# Patient Record
Sex: Female | Born: 1980
Health system: Southern US, Community
[De-identification: ages and names within clinical notes are randomized; demographics above are authoritative.]

## PROBLEM LIST (undated history)

## (undated) DIAGNOSIS — Z789 Other specified health status: Secondary | ICD-10-CM

## (undated) HISTORY — PX: LEG SURGERY: SHX1003

## (undated) HISTORY — PX: WISDOM TOOTH EXTRACTION: SHX21

## (undated) HISTORY — PX: NO PAST SURGERIES: SHX2092

---

## 2012-02-25 ENCOUNTER — Ambulatory Visit (INDEPENDENT_AMBULATORY_CARE_PROVIDER_SITE_OTHER): Payer: No Typology Code available for payment source | Admitting: Gynecology

## 2012-02-25 ENCOUNTER — Encounter: Payer: Self-pay | Admitting: Gynecology

## 2012-02-25 VITALS — BP 112/70 | Wt 116.0 lb

## 2012-02-25 DIAGNOSIS — Z3400 Encounter for supervision of normal first pregnancy, unspecified trimester: Secondary | ICD-10-CM

## 2012-02-26 LAB — OBSTETRIC PANEL
Antibody Screen: NEGATIVE
Basophils Relative: 0 % (ref 0–1)
Eosinophils Absolute: 0.1 10*3/uL (ref 0.0–0.7)
Eosinophils Relative: 1 % (ref 0–5)
HCT: 36.8 % (ref 36.0–46.0)
Hemoglobin: 12.4 g/dL (ref 12.0–15.0)
MCH: 29 pg (ref 26.0–34.0)
MCHC: 33.7 g/dL (ref 30.0–36.0)
MCV: 86 fL (ref 78.0–100.0)
Monocytes Absolute: 0.4 10*3/uL (ref 0.1–1.0)
Monocytes Relative: 4 % (ref 3–12)
RDW: 14.1 % (ref 11.5–15.5)
Rh Type: POSITIVE

## 2012-02-26 LAB — HIV ANTIBODY (ROUTINE TESTING W REFLEX): HIV: NONREACTIVE

## 2012-03-03 ENCOUNTER — Ambulatory Visit: Payer: Self-pay | Admitting: Internal Medicine

## 2012-03-04 ENCOUNTER — Ambulatory Visit (INDEPENDENT_AMBULATORY_CARE_PROVIDER_SITE_OTHER): Payer: No Typology Code available for payment source | Admitting: Obstetrics & Gynecology

## 2012-03-04 VITALS — BP 94/61 | Wt 115.0 lb

## 2012-03-04 DIAGNOSIS — Z113 Encounter for screening for infections with a predominantly sexual mode of transmission: Secondary | ICD-10-CM

## 2012-03-04 DIAGNOSIS — Z3490 Encounter for supervision of normal pregnancy, unspecified, unspecified trimester: Secondary | ICD-10-CM | POA: Insufficient documentation

## 2012-03-04 DIAGNOSIS — Z124 Encounter for screening for malignant neoplasm of cervix: Secondary | ICD-10-CM

## 2012-03-04 DIAGNOSIS — Z3480 Encounter for supervision of other normal pregnancy, unspecified trimester: Secondary | ICD-10-CM

## 2012-03-04 NOTE — Patient Instructions (Signed)
Pregnancy - First Trimester  During sexual intercourse, millions of sperm go into the vagina. Only 1 sperm will penetrate and fertilize the female egg while it is in the Fallopian tube. One week later, the fertilized egg implants into the wall of the uterus. An embryo begins to develop into a baby. At 6 to 8 weeks, the eyes and face are formed and the heartbeat can be seen on ultrasound. At the end of 12 weeks (first trimester), all the baby's organs are formed. Now that you are pregnant, you will want to do everything you can to have a healthy baby. Two of the most important things are to get good prenatal care and follow your caregiver's instructions. Prenatal care is all the medical care you receive before the baby's birth. It is given to prevent, find, and treat problems during the pregnancy and childbirth.  PRENATAL EXAMS   During prenatal visits, your weight, blood pressure and urine are checked. This is done to make sure you are healthy and progressing normally during the pregnancy.   A pregnant woman should gain 25 to 35 pounds during the pregnancy. However, if you are over weight or underweight, your caregiver will advise you regarding your weight.   Your caregiver will ask and answer questions for you.   Blood work, cervical cultures, other necessary tests and a Pap test are done during your prenatal exams. These tests are done to check on your health and the probable health of your baby. Tests are strongly recommended and done for HIV with your permission. This is the virus that causes AIDS. These tests are done because medications can be given to help prevent your baby from being born with this infection should you have been infected without knowing it. Blood work is also used to find out your blood type, previous infections and follow your blood levels (hemoglobin).   Low hemoglobin (anemia) is common during pregnancy. Iron and vitamins are given to help prevent this. Later in the pregnancy, blood  tests for diabetes will be done along with any other tests if any problems develop. You may need tests to make sure you and the baby are doing well.   You may need other tests to make sure you and the baby are doing well.  CHANGES DURING THE FIRST TRIMESTER (THE FIRST 3 MONTHS OF PREGNANCY)  Your body goes through many changes during pregnancy. They vary from person to person. Talk to your caregiver about changes you notice and are concerned about. Changes can include:   Your menstrual period stops.   The egg and sperm carry the genes that determine what you look like. Genes from you and your partner are forming a baby. The female genes determine whether the baby is a boy or a girl.   Your body increases in girth and you may feel bloated.   Feeling sick to your stomach (nauseous) and throwing up (vomiting). If the vomiting is uncontrollable, call your caregiver.   Your breasts will begin to enlarge and become tender.   Your nipples may stick out more and become darker.   The need to urinate more. Painful urination may mean you have a bladder infection.   Tiring easily.   Loss of appetite.   Cravings for certain kinds of food.   At first, you may gain or lose a couple of pounds.   You may have changes in your emotions from day to day (excited to be pregnant or concerned something may go wrong with   the pregnancy and baby).   You may have more vivid and strange dreams.  HOME CARE INSTRUCTIONS    It is very important to avoid all smoking, alcohol and un-prescribed drugs during your pregnancy. These affect the formation and growth of the baby. Avoid chemicals while pregnant to ensure the delivery of a healthy infant.   Start your prenatal visits by the 12th week of pregnancy. They are usually scheduled monthly at first, then more often in the last 2 months before delivery. Keep your caregiver's appointments. Follow your caregiver's instructions regarding medication use, blood and lab tests, exercise, and  diet.   During pregnancy, you are providing food for you and your baby. Eat regular, well-balanced meals. Choose foods such as meat, fish, milk and other low fat dairy products, vegetables, fruits, and whole-grain breads and cereals. Your caregiver will tell you of the ideal weight gain.   You can help morning sickness by keeping soda crackers at the bedside. Eat a couple before arising in the morning. You may want to use the crackers without salt on them.   Eating 4 to 5 small meals rather than 3 large meals a day also may help the nausea and vomiting.   Drinking liquids between meals instead of during meals also seems to help nausea and vomiting.   A physical sexual relationship may be continued throughout pregnancy if there are no other problems. Problems may be early (premature) leaking of amniotic fluid from the membranes, vaginal bleeding, or belly (abdominal) pain.   Exercise regularly if there are no restrictions. Check with your caregiver or physical therapist if you are unsure of the safety of some of your exercises. Greater weight gain will occur in the last 2 trimesters of pregnancy. Exercising will help:   Control your weight.   Keep you in shape.   Prepare you for labor and delivery.   Help you lose your pregnancy weight after you deliver your baby.   Wear a good support or jogging bra for breast tenderness during pregnancy. This may help if worn during sleep too.   Ask when prenatal classes are available. Begin classes when they are offered.   Do not use hot tubs, steam rooms or saunas.   Wear your seat belt when driving. This protects you and your baby if you are in an accident.   Avoid raw meat, uncooked cheese, cat litter boxes and soil used by cats throughout the pregnancy. These carry germs that can cause birth defects in the baby.   The first trimester is a good time to visit your dentist for your dental health. Getting your teeth cleaned is OK. Use a softer toothbrush and brush  gently during pregnancy.   Ask for help if you have financial, counseling or nutritional needs during pregnancy. Your caregiver will be able to offer counseling for these needs as well as refer you for other special needs.   Do not take any medications or herbs unless told by your caregiver.   Inform your caregiver if there is any mental or physical domestic violence.   Make a list of emergency phone numbers of family, friends, hospital, and police and fire departments.   Write down your questions. Take them to your prenatal visit.   Do not douche.   Do not cross your legs.   If you have to stand for long periods of time, rotate you feet or take small steps in a circle.   You may have more vaginal secretions that may   require a sanitary pad. Do not use tampons or scented sanitary pads.  MEDICATIONS AND DRUG USE IN PREGNANCY   Take prenatal vitamins as directed. The vitamin should contain 1 milligram of folic acid. Keep all vitamins out of reach of children. Only a couple vitamins or tablets containing iron may be fatal to a baby or young child when ingested.   Avoid use of all medications, including herbs, over-the-counter medications, not prescribed or suggested by your caregiver. Only take over-the-counter or prescription medicines for pain, discomfort, or fever as directed by your caregiver. Do not use aspirin, ibuprofen, or naproxen unless directed by your caregiver.   Let your caregiver also know about herbs you may be using.   Alcohol is related to a number of birth defects. This includes fetal alcohol syndrome. All alcohol, in any form, should be avoided completely. Smoking will cause low birth rate and premature babies.   Street or illegal drugs are very harmful to the baby. They are absolutely forbidden. A baby born to an addicted mother will be addicted at birth. The baby will go through the same withdrawal an adult does.   Let your caregiver know about any medications that you have to take  and for what reason you take them.  MISCARRIAGE IS COMMON DURING PREGNANCY  A miscarriage does not mean you did something wrong. It is not a reason to worry about getting pregnant again. Your caregiver will help you with questions you may have. If you have a miscarriage, you may need minor surgery.  SEEK MEDICAL CARE IF:   You have any concerns or worries during your pregnancy. It is better to call with your questions if you feel they cannot wait, rather than worry about them.  SEEK IMMEDIATE MEDICAL CARE IF:    An unexplained oral temperature above 102 F (38.9 C) develops, or as your caregiver suggests.   You have leaking of fluid from the vagina (birth canal). If leaking membranes are suspected, take your temperature and inform your caregiver of this when you call.   There is vaginal spotting or bleeding. Notify your caregiver of the amount and how many pads are used.   You develop a bad smelling vaginal discharge with a change in the color.   You continue to feel sick to your stomach (nauseated) and have no relief from remedies suggested. You vomit blood or coffee ground-like materials.   You lose more than 2 pounds of weight in 1 week.   You gain more than 2 pounds of weight in 1 week and you notice swelling of your face, hands, feet, or legs.   You gain 5 pounds or more in 1 week (even if you do not have swelling of your hands, face, legs, or feet).   You get exposed to German measles and have never had them.   You are exposed to fifth disease or chickenpox.   You develop belly (abdominal) pain. Round ligament discomfort is a common non-cancerous (benign) cause of abdominal pain in pregnancy. Your caregiver still must evaluate this.   You develop headache, fever, diarrhea, pain with urination, or shortness of breath.   You fall or are in a car accident or have any kind of trauma.   There is mental or physical violence in your home.  Document Released: 08/19/2001 Document Revised: 08/14/2011  Document Reviewed: 02/20/2009  [employer] Patient Information 2012 [employer]

## 2012-03-04 NOTE — Progress Notes (Signed)
   Subjective:    Sarah Blevins is a G1P0 [redacted]w[redacted]d being seen today for her first obstetrical visit.  Patient reports no complaints.  There were no vitals filed for this visit.  HISTORY: OB History    Grav Para Term Preterm Abortions TAB SAB Ect Mult Living   1              # Outc Date GA Lbr Len/2nd Wgt Sex Del Anes PTL Lv   1 CUR              No past medical history on file. Past Surgical History  Procedure Date  . Leg surgery     right leg blood cyst removal.  . Wisdom tooth extraction     x2   Family History  Problem Relation Age of Onset  . Anemia Mother   . Hypertension Father      Exam   Blood pressure 94/61, weight 115 lb (52.164 kg), last menstrual period 12/13/2011.  Uterus:   12 week size  Pelvic Exam:    Perineum: No Hemorrhoids   Vulva: normal   Vagina:  normal mucosa   Cervix: normal   Adnexa: normal adnexa   Bony Pelvis: gynecoid  System: Breast:  normal appearance, no masses or tenderness   Skin: normal coloration and turgor, no rashes   Neurologic: normal   Extremities: normal strength, tone, and muscle mass   HEENT PERRLA   Mouth/Teeth mucous membranes moist, pharynx normal without lesions and dental hygiene good   Neck supple and no masses   Cardiovascular: regular rate and rhythm   Respiratory:  appears well, vitals normal, no respiratory distress, acyanotic, normal RR   Abdomen: soft, non-tender; bowel sounds normal; no masses,  no organomegaly   Urinary: urethral meatus normal  Bedside u/s showed IUP consistent with LMP for GA, + FH   Assessment:    Pregnancy: G1P0 Patient Active Problem List  Diagnosis  . Supervision of normal pregnancy    Plan:     Initial labs normal. Continue prenatal vitamins. Problem list reviewed and updated. Genetic Screening discussed: Declined First Screen, considering Quad Screen  Ultrasound discussed; fetal survey: will be ordered during next visit.  Follow up in 4 weeks.  Gleason Ardoin  A 03/04/2012

## 2012-03-10 ENCOUNTER — Encounter: Payer: Self-pay | Admitting: Obstetrics & Gynecology

## 2012-03-10 ENCOUNTER — Encounter: Payer: No Typology Code available for payment source | Admitting: Obstetrics & Gynecology

## 2012-03-10 DIAGNOSIS — IMO0002 Reserved for concepts with insufficient information to code with codable children: Secondary | ICD-10-CM

## 2012-03-10 HISTORY — DX: Reserved for concepts with insufficient information to code with codable children: IMO0002

## 2012-03-17 NOTE — Progress Notes (Signed)
Sarah Blevins spoke with husband, per husband will not be able to make an appointment until 3 months when he re-establish his medical insurance.

## 2012-03-22 ENCOUNTER — Ambulatory Visit: Payer: Self-pay | Admitting: Family Medicine

## 2012-07-19 ENCOUNTER — Ambulatory Visit (HOSPITAL_COMMUNITY)
Admission: RE | Admit: 2012-07-19 | Discharge: 2012-07-19 | Disposition: A | Discharge status: Home / Self Care | Payer: BC Managed Care – PPO | Source: Ambulatory Visit | Attending: Family Medicine | Admitting: Family Medicine

## 2012-07-19 ENCOUNTER — Encounter: Payer: Self-pay | Admitting: Family Medicine

## 2012-07-19 ENCOUNTER — Ambulatory Visit (INDEPENDENT_AMBULATORY_CARE_PROVIDER_SITE_OTHER): Payer: BC Managed Care – PPO | Admitting: Family Medicine

## 2012-07-19 VITALS — BP 96/64 | Wt 145.0 lb

## 2012-07-19 DIAGNOSIS — IMO0002 Reserved for concepts with insufficient information to code with codable children: Secondary | ICD-10-CM

## 2012-07-19 DIAGNOSIS — O0930 Supervision of pregnancy with insufficient antenatal care, unspecified trimester: Secondary | ICD-10-CM | POA: Insufficient documentation

## 2012-07-19 DIAGNOSIS — Z3490 Encounter for supervision of normal pregnancy, unspecified, unspecified trimester: Secondary | ICD-10-CM

## 2012-07-19 DIAGNOSIS — Z36 Encounter for antenatal screening of mother: Secondary | ICD-10-CM | POA: Insufficient documentation

## 2012-07-19 DIAGNOSIS — Z3480 Encounter for supervision of other normal pregnancy, unspecified trimester: Secondary | ICD-10-CM

## 2012-07-19 DIAGNOSIS — Z23 Encounter for immunization: Secondary | ICD-10-CM

## 2012-07-19 DIAGNOSIS — R87811 Vaginal high risk human papillomavirus (HPV) DNA test positive: Secondary | ICD-10-CM

## 2012-07-19 MED ORDER — TETANUS-DIPHTH-ACELL PERTUSSIS 5-2.5-18.5 LF-MCG/0.5 IM SUSP: 0.5000 mL | Freq: Once | INTRAMUSCULAR | Status: DC

## 2012-07-19 NOTE — Progress Notes (Signed)
Needs U/S for anatomy 28 wk labs Will hold colpo until pp. TDaP

## 2012-07-19 NOTE — Patient Instructions (Signed)
Pregnancy - Third Trimester The third trimester of pregnancy (the last 3 months) is a period of the most rapid growth for you and your baby. The baby approaches a length of 20 inches and a weight of 6 to 10 pounds. The baby is adding on fat and getting ready for life outside your body. While inside, babies have periods of sleeping and waking, suck their thumbs, and hiccups. You can often feel small contractions of the uterus. This is false labor. It is also called Braxton-Hicks contractions. This is like a practice for labor. The usual problems in this stage of pregnancy include more difficulty breathing, swelling of the hands and feet from water retention, and having to urinate more often because of the uterus and baby pressing on your bladder.  PRENATAL EXAMS  Blood work may continue to be done during prenatal exams. These tests are done to check on your health and the probable health of your baby. Blood work is used to follow your blood levels (hemoglobin). Anemia (low hemoglobin) is common during pregnancy. Iron and vitamins are given to help prevent this. You may also continue to be checked for diabetes. Some of the past blood tests may be done again.  The size of the uterus is measured during each visit. This makes sure your baby is growing properly according to your pregnancy dates.  Your blood pressure is checked every prenatal visit. This is to make sure you are not getting toxemia.  Your urine is checked every prenatal visit for infection, diabetes and protein.  Your weight is checked at each visit. This is done to make sure gains are happening at the suggested rate and that you and your baby are growing normally.  Sometimes, an ultrasound is performed to confirm the position and the proper growth and development of the baby. This is a test done that bounces harmless sound waves off the baby so your caregiver can more accurately determine due dates.  Discuss the type of pain medication  and anesthesia you will have during your labor and delivery.  Discuss the possibility and anesthesia if a Cesarean Section might be necessary.  Inform your caregiver if there is any mental or physical violence at home. Sometimes, a specialized non-stress test, contraction stress test and biophysical profile are done to make sure the baby is not having a problem. Checking the amniotic fluid surrounding the baby is called an amniocentesis. The amniotic fluid is removed by sticking a needle into the belly (abdomen). This is sometimes done near the end of pregnancy if an early delivery is required. In this case, it is done to help make sure the baby's lungs are mature enough for the baby to live outside of the womb. If the lungs are not mature and it is unsafe to deliver the baby, an injection of cortisone medication is given to the mother 1 to 2 days before the delivery. This helps the baby's lungs mature and makes it safer to deliver the baby. CHANGES OCCURING IN THE THIRD TRIMESTER OF PREGNANCY Your body goes through many changes during pregnancy. They vary from person to person. Talk to your caregiver about changes you notice and are concerned about.  During the last trimester, you have probably had an increase in your appetite. It is normal to have cravings for certain foods. This varies from person to person and pregnancy to pregnancy.  You may begin to get stretch marks on your hips, abdomen, and breasts. These are normal changes in the body   during pregnancy. There are no exercises or medications to take which prevent this change.  Constipation may be treated with a stool softener or adding bulk to your diet. Drinking lots of fluids, fiber in vegetables, fruits, and whole grains are helpful.  Exercising is also helpful. If you have been very active up until your pregnancy, most of these activities can be continued during your pregnancy. If you have been less active, it is helpful to start an  exercise program such as walking. Consult your caregiver before starting exercise programs.  Avoid all smoking, alcohol, un-prescribed drugs, herbs and "street drugs" during your pregnancy. These chemicals affect the formation and growth of the baby. Avoid chemicals throughout the pregnancy to ensure the delivery of a healthy infant.  Backache, varicose veins and hemorrhoids may develop or get worse.  You will tire more easily in the third trimester, which is normal.  The baby's movements may be stronger and more often.  You may become short of breath easily.  Your belly button may stick out.  A yellow discharge may leak from your breasts called colostrum.  You may have a bloody mucus discharge. This usually occurs a few days to a week before labor begins. HOME CARE INSTRUCTIONS   Keep your caregiver's appointments. Follow your caregiver's instructions regarding medication use, exercise, and diet.  During pregnancy, you are providing food for you and your baby. Continue to eat regular, well-balanced meals. Choose foods such as meat, fish, milk and other low fat dairy products, vegetables, fruits, and whole-grain breads and cereals. Your caregiver will tell you of the ideal weight gain.  A physical sexual relationship may be continued throughout pregnancy if there are no other problems such as early (premature) leaking of amniotic fluid from the membranes, vaginal bleeding, or belly (abdominal) pain.  Exercise regularly if there are no restrictions. Check with your caregiver if you are unsure of the safety of your exercises. Greater weight gain will occur in the last 2 trimesters of pregnancy. Exercising helps:  Control your weight.  Get you in shape for labor and delivery.  You lose weight after you deliver.  Rest a lot with legs elevated, or as needed for leg cramps or low back pain.  Wear a good support or jogging bra for breast tenderness during pregnancy. This may help if worn  during sleep. Pads or tissues may be used in the bra if you are leaking colostrum.  Do not use hot tubs, steam rooms, or saunas.  Wear your seat belt when driving. This protects you and your baby if you are in an accident.  Avoid raw meat, cat litter boxes and soil used by cats. These carry germs that can cause birth defects in the baby.  It is easier to loose urine during pregnancy. Tightening up and strengthening the pelvic muscles will help with this problem. You can practice stopping your urination while you are going to the bathroom. These are the same muscles you need to strengthen. It is also the muscles you would use if you were trying to stop from passing gas. You can practice tightening these muscles up 10 times a set and repeating this about 3 times per day. Once you know what muscles to tighten up, do not perform these exercises during urination. It is more likely to cause an infection by backing up the urine.  Ask for help if you have financial, counseling or nutritional needs during pregnancy. Your caregiver will be able to offer counseling for these   needs as well as refer you for other special needs.  Make a list of emergency phone numbers and have them available.  Plan on getting help from family or friends when you go home from the hospital.  Make a trial run to the hospital.  Take prenatal classes with the father to understand, practice and ask questions about the labor and delivery.  Prepare the baby's room/nursery.  Do not travel out of the city unless it is absolutely necessary and with the advice of your caregiver.  Wear only low or no heal shoes to have better balance and prevent falling. MEDICATIONS AND DRUG USE IN PREGNANCY  Take prenatal vitamins as directed. The vitamin should contain 1 milligram of folic acid. Keep all vitamins out of reach of children. Only a couple vitamins or tablets containing iron may be fatal to a baby or young child when  ingested.  Avoid use of all medications, including herbs, over-the-counter medications, not prescribed or suggested by your caregiver. Only take over-the-counter or prescription medicines for pain, discomfort, or fever as directed by your caregiver. Do not use aspirin, ibuprofen (Motrin, Advil, Nuprin) or naproxen (Aleve) unless OK'd by your caregiver.  Let your caregiver also know about herbs you may be using.  Alcohol is related to a number of birth defects. This includes fetal alcohol syndrome. All alcohol, in any form, should be avoided completely. Smoking will cause low birth rate and premature babies.  Street/illegal drugs are very harmful to the baby. They are absolutely forbidden. A baby born to an addicted mother will be addicted at birth. The baby will go through the same withdrawal an adult does. SEEK MEDICAL CARE IF: You have any concerns or worries during your pregnancy. It is better to call with your questions if you feel they cannot wait, rather than worry about them. DECISIONS ABOUT CIRCUMCISION You may or may not know the sex of your baby. If you know your baby is a boy, it may be time to think about circumcision. Circumcision is the removal of the foreskin of the penis. This is the skin that covers the sensitive end of the penis. There is no proven medical need for this. Often this decision is made on what is popular at the time or based upon religious beliefs and social issues. You can discuss these issues with your caregiver or pediatrician. SEEK IMMEDIATE MEDICAL CARE IF:   An unexplained oral temperature above 102 F (38.9 C) develops, or as your caregiver suggests.  You have leaking of fluid from the vagina (birth canal). If leaking membranes are suspected, take your temperature and tell your caregiver of this when you call.  There is vaginal spotting, bleeding or passing clots. Tell your caregiver of the amount and how many pads are used.  You develop a bad smelling  vaginal discharge with a change in the color from clear to white.  You develop vomiting that lasts more than 24 hours.  You develop chills or fever.  You develop shortness of breath.  You develop burning on urination.  You loose more than 2 pounds of weight or gain more than 2 pounds of weight or as suggested by your caregiver.  You notice sudden swelling of your face, hands, and feet or legs.  You develop belly (abdominal) pain. Round ligament discomfort is a common non-cancerous (benign) cause of abdominal pain in pregnancy. Your caregiver still must evaluate you.  You develop a severe headache that does not go away.  You develop visual   problems, blurred or double vision.  If you have not felt your baby move for more than 1 hour. If you think the baby is not moving as much as usual, eat something with sugar in it and lie down on your left side for an hour. The baby should move at least 4 to 5 times per hour. Call right away if your baby moves less than that.  You fall, are in a car accident or any kind of trauma.  There is mental or physical violence at home. Document Released: 08/19/2001 Document Revised: 11/17/2011 Document Reviewed: 02/21/2009 [employer] Patient Information 2013 [employer].  Breastfeeding Deciding to breastfeed is one of the best choices you can make for you and your baby. The information that follows gives a brief overview of the benefits of breastfeeding as well as common topics surrounding breastfeeding. BENEFITS OF BREASTFEEDING For the baby  The first milk (colostrum) helps the baby's digestive system function better.   There are antibodies in the mother's milk that help the baby fight off infections.   The baby has a lower incidence of asthma, allergies, and sudden infant death syndrome (SIDS).   The nutrients in breast milk are better for the baby than infant formulas, and breast milk helps the baby's brain grow better.   Babies who  breastfeed have less gas, colic, and constipation.  For the mother  Breastfeeding helps develop a very special bond between the mother and her baby.   Breastfeeding is convenient, always available at the correct temperature, and costs nothing.   Breastfeeding burns calories in the mother and helps her lose weight that was gained during pregnancy.   Breastfeeding makes the uterus contract back down to normal size faster and slows bleeding following delivery.   Breastfeeding mothers have a lower risk of developing breast cancer.  BREASTFEEDING FREQUENCY  A healthy, full-term baby may breastfeed as often as every hour or space his or her feedings to every 3 hours.   Watch your baby for signs of hunger. Nurse your baby if he or she shows signs of hunger. How often you nurse will vary from baby to baby.   Nurse as often as the baby requests, or when you feel the need to reduce the fullness of your breasts.   Awaken the baby if it has been 3 4 hours since the last feeding.   Frequent feeding will help the mother make more milk and will help prevent problems, such as sore nipples and engorgement of the breasts.  BABY'S POSITION AT THE BREAST  Whether lying down or sitting, be sure that the baby's tummy is facing your tummy.   Support the breast with 4 fingers underneath the breast and the thumb above. Make sure your fingers are well away from the nipple and baby's mouth.   Stroke the baby's lips gently with your finger or nipple.   When the baby's mouth is open wide enough, place all of your nipple and as much of the areola as possible into your baby's mouth.   Pull the baby in close so the tip of the nose and the baby's cheeks touch the breast during the feeding.  FEEDINGS AND SUCTION  The length of each feeding varies from baby to baby and from feeding to feeding.   The baby must suck about 2 3 minutes for your milk to get to him or her. This is called a "let down."  For this reason, allow the baby to feed on each breast as   long as he or she wants. Your baby will end the feeding when he or she has received the right balance of nutrients.   To break the suction, put your finger into the corner of the baby's mouth and slide it between his or her gums before removing your breast from his or her mouth. This will help prevent sore nipples.  HOW TO TELL WHETHER YOUR BABY IS GETTING ENOUGH BREAST MILK. Wondering whether or not your baby is getting enough milk is a common concern among mothers. You can be assured that your baby is getting enough milk if:   Your baby is actively sucking and you hear swallowing.   Your baby seems relaxed and satisfied after a feeding.   Your baby nurses at least 8 12 times in a 24 hour time period. Nurse your baby until he or she unlatches or falls asleep at the first breast (at least 10 20 minutes), then offer the second side.   Your baby is wetting 5 6 disposable diapers (6 8 cloth diapers) in a 24 hour period by 5 6 days of age.   Your baby is having at least 3 4 stools every 24 hours for the first 6 weeks. The stool should be soft and yellow.   Your baby should gain 4 7 ounces per week after he or she is 4 days old.   Your breasts feel softer after nursing.  REDUCING BREAST ENGORGEMENT  In the first week after your baby is born, you may experience signs of breast engorgement. When breasts are engorged, they feel heavy, warm, full, and may be tender to the touch. You can reduce engorgement if you:   Nurse frequently, every 2 3 hours. Mothers who breastfeed early and often have fewer problems with engorgement.   Place light ice packs on your breasts for 10 20 minutes between feedings. This reduces swelling. Wrap the ice packs in a lightweight towel to protect your skin. Bags of frozen vegetables work well for this purpose.   Take a warm shower or apply warm, moist heat to your breast for 5 10 minutes just before  each feeding. This increases circulation and helps the milk flow.   Gently massage your breast before and during the feeding. Using your finger tips, massage from the chest wall towards your nipple in a circular motion.   Make sure that the baby empties at least one breast at every feeding before switching sides.   Use a breast pump to empty the breasts if your baby is sleepy or not nursing well. You may also want to pump if you are returning to work oryou feel you are getting engorged.   Avoid bottle feeds, pacifiers, or supplemental feedings of water or juice in place of breastfeeding. Breast milk is all the food your baby needs. It is not necessary for your baby to have water or formula. In fact, to help your breasts make more milk, it is best not to give your baby supplemental feedings during the early weeks.   Be sure the baby is latched on and positioned properly while breastfeeding.   Wear a supportive bra, avoiding underwire styles.   Eat a balanced diet with enough fluids.   Rest often, relax, and take your prenatal vitamins to prevent fatigue, stress, and anemia.  If you follow these suggestions, your engorgement should improve in 24 48 hours. If you are still experiencing difficulty, call your lactation consultant or caregiver.  CARING FOR YOURSELF Take care of your   breasts  Bathe or shower daily.   Avoid using soap on your nipples.   Start feedings on your left breast at one feeding and on your right breast at the next feeding.   You will notice an increase in your milk supply 2 5 days after delivery. You may feel some discomfort from engorgement, which makes your breasts very firm and often tender. Engorgement "peaks" out within 24 48 hours. In the meantime, apply warm moist towels to your breasts for 5 10 minutes before feeding. Gentle massage and expression of some milk before feeding will soften your breasts, making it easier for your baby to latch on.    Wear a well-fitting nursing bra, and air dry your nipples for a 3 4minutes after each feeding.   Only use cotton bra pads.   Only use pure lanolin on your nipples after nursing. You do not need to wash it off before feeding the baby again. Another option is to express a few drops of breast milk and gently massage it into your nipples.  Take care of yourself  Eat well-balanced meals and nutritious snacks.   Drinking milk, fruit juice, and water to satisfy your thirst (about 8 glasses a day).   Get plenty of rest.  Avoid foods that you notice affect the baby in a bad way.  SEEK MEDICAL CARE IF:   You have difficulty with breastfeeding and need help.   You have a hard, red, sore area on your breast that is accompanied by a fever.   Your baby is too sleepy to eat well or is having trouble sleeping.   Your baby is wetting less than 6 diapers a day, by 5 days of age.   Your baby's skin or white part of his or her eyes is more yellow than it was in the hospital.   You feel depressed.  Document Released: 08/25/2005 Document Revised: 02/24/2012 Document Reviewed: 11/23/2011 [employer] Patient Information 2013 [employer].  

## 2012-07-20 ENCOUNTER — Encounter: Payer: Self-pay | Admitting: Family Medicine

## 2012-07-20 LAB — GLUCOSE TOLERANCE, 1 HOUR (50G) W/O FASTING: Glucose, 1 Hour GTT: 123 mg/dL (ref 70–140)

## 2012-07-20 LAB — RPR

## 2012-08-25 ENCOUNTER — Ambulatory Visit (INDEPENDENT_AMBULATORY_CARE_PROVIDER_SITE_OTHER): Payer: BC Managed Care – PPO | Admitting: Obstetrics & Gynecology

## 2012-08-25 VITALS — BP 89/61 | Wt 148.0 lb

## 2012-08-25 DIAGNOSIS — O26899 Other specified pregnancy related conditions, unspecified trimester: Secondary | ICD-10-CM

## 2012-08-25 DIAGNOSIS — B373 Candidiasis of vulva and vagina: Secondary | ICD-10-CM

## 2012-08-25 DIAGNOSIS — Z3490 Encounter for supervision of normal pregnancy, unspecified, unspecified trimester: Secondary | ICD-10-CM

## 2012-08-25 DIAGNOSIS — N898 Other specified noninflammatory disorders of vagina: Secondary | ICD-10-CM

## 2012-08-25 DIAGNOSIS — Z3480 Encounter for supervision of other normal pregnancy, unspecified trimester: Secondary | ICD-10-CM

## 2012-08-25 NOTE — Patient Instructions (Signed)
Return to clinic for any obstetric concerns or go to [facility] for evaluation  

## 2012-08-25 NOTE — Progress Notes (Signed)
Normal anatomy scan and third trimester labs. Pelvic cultures done, yellow discharge seen, wet prep obtained.  Will follow up results and manage accordingly.  No other complaints or concerns.  Fetal movement and labor precautions reviewed.

## 2012-08-26 LAB — GC/CHLAMYDIA PROBE AMP, GENITAL
Chlamydia, DNA Probe: NEGATIVE
GC Probe Amp, Genital: NEGATIVE

## 2012-08-26 LAB — WET PREP, GENITAL: Trich, Wet Prep: NONE SEEN

## 2012-08-26 MED ORDER — FLUCONAZOLE 150 MG PO TABS: 150.0000 mg | ORAL_TABLET | Freq: Once | ORAL | Status: DC

## 2012-08-26 NOTE — Addendum Note (Signed)
Addended by: Jaynie Collins A on: 08/26/2012 10:26 AM   Modules accepted: Orders

## 2012-08-26 NOTE — Progress Notes (Signed)
TNTC yeast on wet prep, Diflucan prescribed.

## 2012-08-31 ENCOUNTER — Encounter: Payer: Self-pay | Admitting: Obstetrics & Gynecology

## 2012-09-02 ENCOUNTER — Ambulatory Visit (INDEPENDENT_AMBULATORY_CARE_PROVIDER_SITE_OTHER): Payer: BC Managed Care – PPO | Admitting: Family Medicine

## 2012-09-02 ENCOUNTER — Encounter: Payer: Self-pay | Admitting: Family Medicine

## 2012-09-02 VITALS — BP 101/69 | Wt 153.0 lb

## 2012-09-02 DIAGNOSIS — Z3490 Encounter for supervision of normal pregnancy, unspecified, unspecified trimester: Secondary | ICD-10-CM

## 2012-09-02 DIAGNOSIS — Z3480 Encounter for supervision of other normal pregnancy, unspecified trimester: Secondary | ICD-10-CM

## 2012-09-02 NOTE — Progress Notes (Signed)
Doing well No complaints Labor precautions.

## 2012-09-02 NOTE — Patient Instructions (Addendum)
Breastfeeding Deciding to breastfeed is one of the best choices you can make for you and your baby. The information that follows gives a brief overview of the benefits of breastfeeding as well as common topics surrounding breastfeeding. BENEFITS OF BREASTFEEDING For the baby  The first milk (colostrum) helps the baby's digestive system function better.   There are antibodies in the mother's milk that help the baby fight off infections.   The baby has a lower incidence of asthma, allergies, and sudden infant death syndrome (SIDS).   The nutrients in breast milk are better for the baby than infant formulas, and breast milk helps the baby's brain grow better.   Babies who breastfeed have less gas, colic, and constipation.  For the mother  Breastfeeding helps develop a very special bond between the mother and her baby.   Breastfeeding is convenient, always available at the correct temperature, and costs nothing.   Breastfeeding burns calories in the mother and helps her lose weight that was gained during pregnancy.   Breastfeeding makes the uterus contract back down to normal size faster and slows bleeding following delivery.   Breastfeeding mothers have a lower risk of developing breast cancer.  BREASTFEEDING FREQUENCY  A healthy, full-term baby may breastfeed as often as every hour or space his or her feedings to every 3 hours.   Watch your baby for signs of hunger. Nurse your baby if he or she shows signs of hunger. How often you nurse will vary from baby to baby.   Nurse as often as the baby requests, or when you feel the need to reduce the fullness of your breasts.   Awaken the baby if it has been 3 4 hours since the last feeding.   Frequent feeding will help the mother make more milk and will help prevent problems, such as sore nipples and engorgement of the breasts.  BABY'S POSITION AT THE BREAST  Whether lying down or sitting, be sure that the baby's tummy  is facing your tummy.   Support the breast with 4 fingers underneath the breast and the thumb above. Make sure your fingers are well away from the nipple and baby's mouth.   Stroke the baby's lips gently with your finger or nipple.   When the baby's mouth is open wide enough, place all of your nipple and as much of the areola as possible into your baby's mouth.   Pull the baby in close so the tip of the nose and the baby's cheeks touch the breast during the feeding.  FEEDINGS AND SUCTION  The length of each feeding varies from baby to baby and from feeding to feeding.   The baby must suck about 2 3 minutes for your milk to get to him or her. This is called a "let down." For this reason, allow the baby to feed on each breast as long as he or she wants. Your baby will end the feeding when he or she has received the right balance of nutrients.   To break the suction, put your finger into the corner of the baby's mouth and slide it between his or her gums before removing your breast from his or her mouth. This will help prevent sore nipples.  HOW TO TELL WHETHER YOUR BABY IS GETTING ENOUGH BREAST MILK. Wondering whether or not your baby is getting enough milk is a common concern among mothers. You can be assured that your baby is getting enough milk if:   Your baby is actively   sucking and you hear swallowing.   Your baby seems relaxed and satisfied after a feeding.   Your baby nurses at least 8 12 times in a 24 hour time period. Nurse your baby until he or she unlatches or falls asleep at the first breast (at least 10 20 minutes), then offer the second side.   Your baby is wetting 5 6 disposable diapers (6 8 cloth diapers) in a 24 hour period by 5 6 days of age.   Your baby is having at least 3 4 stools every 24 hours for the first 6 weeks. The stool should be soft and yellow.   Your baby should gain 4 7 ounces per week after he or she is 4 days old.   Your breasts feel  softer after nursing.  REDUCING BREAST ENGORGEMENT  In the first week after your baby is born, you may experience signs of breast engorgement. When breasts are engorged, they feel heavy, warm, full, and may be tender to the touch. You can reduce engorgement if you:   Nurse frequently, every 2 3 hours. Mothers who breastfeed early and often have fewer problems with engorgement.   Place light ice packs on your breasts for 10 20 minutes between feedings. This reduces swelling. Wrap the ice packs in a lightweight towel to protect your skin. Bags of frozen vegetables work well for this purpose.   Take a warm shower or apply warm, moist heat to your breast for 5 10 minutes just before each feeding. This increases circulation and helps the milk flow.   Gently massage your breast before and during the feeding. Using your finger tips, massage from the chest wall towards your nipple in a circular motion.   Make sure that the baby empties at least one breast at every feeding before switching sides.   Use a breast pump to empty the breasts if your baby is sleepy or not nursing well. You may also want to pump if you are returning to work oryou feel you are getting engorged.   Avoid bottle feeds, pacifiers, or supplemental feedings of water or juice in place of breastfeeding. Breast milk is all the food your baby needs. It is not necessary for your baby to have water or formula. In fact, to help your breasts make more milk, it is best not to give your baby supplemental feedings during the early weeks.   Be sure the baby is latched on and positioned properly while breastfeeding.   Wear a supportive bra, avoiding underwire styles.   Eat a balanced diet with enough fluids.   Rest often, relax, and take your prenatal vitamins to prevent fatigue, stress, and anemia.  If you follow these suggestions, your engorgement should improve in 24 48 hours. If you are still experiencing difficulty, call  your lactation consultant or caregiver.  CARING FOR YOURSELF Take care of your breasts  Bathe or shower daily.   Avoid using soap on your nipples.   Start feedings on your left breast at one feeding and on your right breast at the next feeding.   You will notice an increase in your milk supply 2 5 days after delivery. You may feel some discomfort from engorgement, which makes your breasts very firm and often tender. Engorgement "peaks" out within 24 48 hours. In the meantime, apply warm moist towels to your breasts for 5 10 minutes before feeding. Gentle massage and expression of some milk before feeding will soften your breasts, making it easier for your   baby to latch on.   Wear a well-fitting nursing bra, and air dry your nipples for a 3 4minutes after each feeding.   Only use cotton bra pads.   Only use pure lanolin on your nipples after nursing. You do not need to wash it off before feeding the baby again. Another option is to express a few drops of breast milk and gently massage it into your nipples.  Take care of yourself  Eat well-balanced meals and nutritious snacks.   Drinking milk, fruit juice, and water to satisfy your thirst (about 8 glasses a day).   Get plenty of rest.  Avoid foods that you notice affect the baby in a bad way.  SEEK MEDICAL CARE IF:   You have difficulty with breastfeeding and need help.   You have a hard, red, sore area on your breast that is accompanied by a fever.   Your baby is too sleepy to eat well or is having trouble sleeping.   Your baby is wetting less than 6 diapers a day, by 5 days of age.   Your baby's skin or white part of his or her eyes is more yellow than it was in the hospital.   You feel depressed.  Document Released: 08/25/2005 Document Revised: 02/24/2012 Document Reviewed: 11/23/2011 Exit[employer] Patient Information 2013 [employer]. Normal Labor and Delivery Your caregiver must first be sure you are in  labor. Signs of labor include:  You may pass what is called "the mucus plug" before labor begins. This is a small amount of blood stained mucus.  Regular uterine contractions.  The time between contractions get closer together.  The discomfort and pain gradually gets more intense.  Pains are mostly located in the back.  Pains get worse when walking.  The cervix (the opening of the uterus becomes thinner (begins to efface) and opens up (dilates). Once you are in labor and admitted into the hospital or care center, your caregiver will do the following:  A complete physical examination.  Check your vital signs (blood pressure, pulse, temperature and the fetal heart rate).  Do a vaginal examination (using a sterile glove and lubricant) to determine:  The position (presentation) of the baby (head [vertex] or buttock first).  The level (station) of the baby's head in the birth canal.  The effacement and dilatation of the cervix.  You may have your pubic hair shaved and be given an enema depending on your caregiver and the circumstance.  An electronic monitor is usually placed on your abdomen. The monitor follows the length and intensity of the contractions, as well as the baby's heart rate.  Usually, your caregiver will insert an IV in your arm with a bottle of sugar water. This is done as a precaution so that medications can be given to you quickly during labor or delivery. NORMAL LABOR AND DELIVERY IS DIVIDED UP INTO 3 STAGES: First Stage This is when regular contractions begin and the cervix begins to efface and dilate. This stage can last from 3 to 15 hours. The end of the first stage is when the cervix is 100% effaced and 10 centimeters dilated. Pain medications may be given by   Injection (morphine, demerol, etc.)  Regional anesthesia (spinal, caudal or epidural, anesthetics given in different locations of the spine). Paracervical pain medication may be given, which is an  injection of and anesthetic on each side of the cervix. A pregnant woman may request to have "Natural Childbirth" which is not to have   any medications or anesthesia during her labor and delivery. Second Stage This is when the baby comes down through the birth canal (vagina) and is born. This can take 1 to 4 hours. As the baby's head comes down through the birth canal, you may feel like you are going to have a bowel movement. You will get the urge to bear down and push until the baby is delivered. As the baby's head is being delivered, the caregiver will decide if an episiotomy (a cut in the perineum and vagina area) is needed to prevent tearing of the tissue in this area. The episiotomy is sewn up after the delivery of the baby and placenta. Sometimes a mask with nitrous oxide is given for the mother to breath during the delivery of the baby to help if there is too much pain. The end of Stage 2 is when the baby is fully delivered. Then when the umbilical cord stops pulsating it is clamped and cut. Third Stage The third stage begins after the baby is completely delivered and ends after the placenta (afterbirth) is delivered. This usually takes 5 to 30 minutes. After the placenta is delivered, a medication is given either by intravenous or injection to help contract the uterus and prevent bleeding. The third stage is not painful and pain medication is usually not necessary. If an episiotomy was done, it is repaired at this time. After the delivery, the mother is watched and monitored closely for 1 to 2 hours to make sure there is no postpartum bleeding (hemorrhage). If there is a lot of bleeding, medication is given to contract the uterus and stop the bleeding. Document Released: 06/03/2008 Document Revised: 11/17/2011 Document Reviewed: 06/03/2008 Exit[employer] Patient Information 2013 [employer].  

## 2012-09-02 NOTE — Assessment & Plan Note (Signed)
Doing well 

## 2012-09-08 NOTE — L&D Delivery Note (Signed)
Delivery Note Sarah Blevins is a 32 y.o. G1P1001 presenting at [redacted]w[redacted]d with spontaneous, active labor. She progressed normally to complete dilation and began pushing at 9:15 AM.  At 10:10 AM a viable female was delivered via Vaginal, Spontaneous Delivery (Presentation:Vertex, OA).  APGAR: 9, 9; weight pending.   Placenta status: Intact, Spontaneous.  Cord: 3 vessels with the following complications: 2nd degree laceration.  Cord pH: NA.   Pt received epidural and denied pain during the delivery.  Infant was bulb suctioned, no nuchal cord, cord clamped and cut, infant placed on mother's stomach.    Anesthesia: Epidural  Episiotomy: None Lacerations: 2nd degree;Perineal Suture Repair: 3.0 vicryl Est. Blood Loss (mL): 800  Mom to postpartum.  Baby to nursery-stable.  Gildardo Cranker 09/17/2012, 10:50 AM  I was present for entire delivery and perineal repair. Agree with above note. PPH with uterine atony treated with 1000 mcg cytotec, pitocin and bimanual uterine exam/massage. Napoleon Form, MD 09/17/2012 1:21 PM

## 2012-09-16 ENCOUNTER — Ambulatory Visit (INDEPENDENT_AMBULATORY_CARE_PROVIDER_SITE_OTHER): Payer: BC Managed Care – PPO | Admitting: Obstetrics and Gynecology

## 2012-09-16 ENCOUNTER — Encounter (HOSPITAL_COMMUNITY): Payer: Self-pay | Admitting: *Deleted

## 2012-09-16 ENCOUNTER — Inpatient Hospital Stay (HOSPITAL_COMMUNITY)
Admission: AD | Admit: 2012-09-16 | Discharge: 2012-09-19 | DRG: 774 | Disposition: A | Discharge status: Home / Self Care | Payer: Medicaid Other | Source: Ambulatory Visit | Attending: Obstetrics & Gynecology | Admitting: Obstetrics & Gynecology

## 2012-09-16 ENCOUNTER — Encounter: Payer: Self-pay | Admitting: Obstetrics and Gynecology

## 2012-09-16 VITALS — BP 129/89 | Wt 151.0 lb

## 2012-09-16 DIAGNOSIS — IMO0001 Reserved for inherently not codable concepts without codable children: Secondary | ICD-10-CM

## 2012-09-16 DIAGNOSIS — Z3490 Encounter for supervision of normal pregnancy, unspecified, unspecified trimester: Secondary | ICD-10-CM

## 2012-09-16 DIAGNOSIS — IMO0002 Reserved for concepts with insufficient information to code with codable children: Secondary | ICD-10-CM

## 2012-09-16 DIAGNOSIS — Z3480 Encounter for supervision of other normal pregnancy, unspecified trimester: Secondary | ICD-10-CM

## 2012-09-16 DIAGNOSIS — R87811 Vaginal high risk human papillomavirus (HPV) DNA test positive: Secondary | ICD-10-CM

## 2012-09-16 HISTORY — DX: Other specified health status: Z78.9

## 2012-09-16 NOTE — Progress Notes (Signed)
Patient doing well reports contractions this morning q10 minutes. Felt underwear moist this am and think she may have broken her water. SSE: No pooling, negative nitrazine. Advised to keep monitoring ctx. Will set up for postdate fetal testing if not delivered. Will schedule iol at 41 weeks

## 2012-09-16 NOTE — Addendum Note (Signed)
Addended by: Catalina Antigua on: 09/16/2012 10:34 AM   Modules accepted: Level of Service

## 2012-09-16 NOTE — MAU Note (Signed)
PT SAYS SHE STARTED HURT BAD AT 6PM.   VE IN OFFICE  TODAY--  1-2 CM.   DENIES STD-  INTERPRETER - HUSBAND- FROM [place]

## 2012-09-16 NOTE — Progress Notes (Signed)
Patient states this morning underwear were wet and pains in front and back woke her up.

## 2012-09-17 ENCOUNTER — Encounter (HOSPITAL_COMMUNITY): Payer: Self-pay | Admitting: *Deleted

## 2012-09-17 ENCOUNTER — Encounter (HOSPITAL_COMMUNITY): Payer: Self-pay | Admitting: Anesthesiology

## 2012-09-17 ENCOUNTER — Inpatient Hospital Stay (HOSPITAL_COMMUNITY): Payer: Medicaid Other | Admitting: Anesthesiology

## 2012-09-17 LAB — CBC
HCT: 37.4 % (ref 36.0–46.0)
MCH: 29.8 pg (ref 26.0–34.0)
MCHC: 34 g/dL (ref 30.0–36.0)
MCV: 87.8 fL (ref 78.0–100.0)
Platelets: 193 10*3/uL (ref 150–400)
RDW: 13.7 % (ref 11.5–15.5)
WBC: 13.9 10*3/uL — ABNORMAL HIGH (ref 4.0–10.5)

## 2012-09-17 LAB — TYPE AND SCREEN
ABO/RH(D): B POS
Antibody Screen: NEGATIVE

## 2012-09-17 MED ORDER — ONDANSETRON HCL 4 MG PO TABS: 4.0000 mg | ORAL_TABLET | ORAL | Status: DC | PRN

## 2012-09-17 MED ORDER — OXYCODONE-ACETAMINOPHEN 5-325 MG PO TABS
1.0000 | ORAL_TABLET | ORAL | Status: DC | PRN
  Administered 2012-09-18 – 2012-09-19 (×2): 1 via ORAL
  Filled 2012-09-17 (×2): qty 1

## 2012-09-17 MED ORDER — SENNOSIDES-DOCUSATE SODIUM 8.6-50 MG PO TABS: 2.0000 | ORAL_TABLET | Freq: Every day | ORAL | Status: DC

## 2012-09-17 MED ORDER — PHENYLEPHRINE 40 MCG/ML (10ML) SYRINGE FOR IV PUSH (FOR BLOOD PRESSURE SUPPORT)
80.0000 ug | PREFILLED_SYRINGE | INTRAVENOUS | Status: DC | PRN
  Filled 2012-09-17: qty 2

## 2012-09-17 MED ORDER — LACTATED RINGERS IV SOLN
INTRAVENOUS | Status: DC
  Administered 2012-09-17 (×2): via INTRAVENOUS

## 2012-09-17 MED ORDER — EPHEDRINE 5 MG/ML INJ
10.0000 mg | INTRAVENOUS | Status: DC | PRN
  Filled 2012-09-17: qty 2
  Filled 2012-09-17: qty 4

## 2012-09-17 MED ORDER — LANOLIN HYDROUS EX OINT: TOPICAL_OINTMENT | CUTANEOUS | Status: DC | PRN

## 2012-09-17 MED ORDER — WITCH HAZEL-GLYCERIN EX PADS: 1.0000 "application " | MEDICATED_PAD | CUTANEOUS | Status: DC | PRN

## 2012-09-17 MED ORDER — OXYCODONE-ACETAMINOPHEN 5-325 MG PO TABS: 1.0000 | ORAL_TABLET | ORAL | Status: DC | PRN

## 2012-09-17 MED ORDER — FENTANYL 2.5 MCG/ML BUPIVACAINE 1/10 % EPIDURAL INFUSION (WH - ANES)
14.0000 mL/h | INTRAMUSCULAR | Status: DC
  Administered 2012-09-17: 14 mL/h via EPIDURAL
  Filled 2012-09-17 (×2): qty 125

## 2012-09-17 MED ORDER — EPHEDRINE 5 MG/ML INJ
10.0000 mg | INTRAVENOUS | Status: DC | PRN
  Filled 2012-09-17: qty 2

## 2012-09-17 MED ORDER — IBUPROFEN 600 MG PO TABS
600.0000 mg | ORAL_TABLET | Freq: Four times a day (QID) | ORAL | Status: DC
  Administered 2012-09-17 – 2012-09-19 (×7): 600 mg via ORAL
  Filled 2012-09-17 (×7): qty 1

## 2012-09-17 MED ORDER — LACTATED RINGERS IV SOLN: INTRAVENOUS | Status: DC

## 2012-09-17 MED ORDER — LIDOCAINE HCL (PF) 1 % IJ SOLN
30.0000 mL | INTRAMUSCULAR | Status: DC | PRN
  Filled 2012-09-17: qty 30

## 2012-09-17 MED ORDER — ACETAMINOPHEN 325 MG PO TABS: 650.0000 mg | ORAL_TABLET | ORAL | Status: DC | PRN

## 2012-09-17 MED ORDER — LIDOCAINE HCL (PF) 1 % IJ SOLN
INTRAMUSCULAR | Status: DC | PRN
  Administered 2012-09-17 (×2): 9 mL

## 2012-09-17 MED ORDER — DIPHENHYDRAMINE HCL 50 MG/ML IJ SOLN: 12.5000 mg | INTRAMUSCULAR | Status: DC | PRN

## 2012-09-17 MED ORDER — LACTATED RINGERS IV SOLN: 500.0000 mL | INTRAVENOUS | Status: DC | PRN

## 2012-09-17 MED ORDER — ONDANSETRON HCL 4 MG/2ML IJ SOLN: 4.0000 mg | INTRAMUSCULAR | Status: DC | PRN

## 2012-09-17 MED ORDER — IBUPROFEN 600 MG PO TABS
600.0000 mg | ORAL_TABLET | Freq: Four times a day (QID) | ORAL | Status: DC | PRN
  Filled 2012-09-17: qty 1

## 2012-09-17 MED ORDER — TETANUS-DIPHTH-ACELL PERTUSSIS 5-2.5-18.5 LF-MCG/0.5 IM SUSP: 0.5000 mL | Freq: Once | INTRAMUSCULAR | Status: DC

## 2012-09-17 MED ORDER — PRENATAL MULTIVITAMIN CH
1.0000 | ORAL_TABLET | Freq: Every day | ORAL | Status: DC
  Administered 2012-09-18 – 2012-09-19 (×2): 1 via ORAL
  Filled 2012-09-17 (×2): qty 1

## 2012-09-17 MED ORDER — FENTANYL 2.5 MCG/ML BUPIVACAINE 1/10 % EPIDURAL INFUSION (WH - ANES)
INTRAMUSCULAR | Status: DC | PRN
  Administered 2012-09-17: 14 mL/h via EPIDURAL

## 2012-09-17 MED ORDER — PHENYLEPHRINE 40 MCG/ML (10ML) SYRINGE FOR IV PUSH (FOR BLOOD PRESSURE SUPPORT)
80.0000 ug | PREFILLED_SYRINGE | INTRAVENOUS | Status: DC | PRN
  Filled 2012-09-17: qty 5
  Filled 2012-09-17: qty 2

## 2012-09-17 MED ORDER — SODIUM CHLORIDE 0.9 % IJ SOLN
3.0000 mL | INTRAMUSCULAR | Status: DC | PRN
  Administered 2012-09-17: 3 mL via INTRAVENOUS

## 2012-09-17 MED ORDER — LACTATED RINGERS IV SOLN
500.0000 mL | Freq: Once | INTRAVENOUS | Status: AC
  Administered 2012-09-17: 1000 mL via INTRAVENOUS

## 2012-09-17 MED ORDER — MISOPROSTOL 200 MCG PO TABS
ORAL_TABLET | ORAL | Status: AC
  Administered 2012-09-17: 200 ug
  Filled 2012-09-17: qty 5

## 2012-09-17 MED ORDER — CITRIC ACID-SODIUM CITRATE 334-500 MG/5ML PO SOLN: 30.0000 mL | ORAL | Status: DC | PRN

## 2012-09-17 MED ORDER — ZOLPIDEM TARTRATE 5 MG PO TABS: 5.0000 mg | ORAL_TABLET | Freq: Every evening | ORAL | Status: DC | PRN

## 2012-09-17 MED ORDER — BENZOCAINE-MENTHOL 20-0.5 % EX AERO
1.0000 "application " | INHALATION_SPRAY | CUTANEOUS | Status: DC | PRN
  Filled 2012-09-17: qty 56

## 2012-09-17 MED ORDER — OXYTOCIN 40 UNITS IN LACTATED RINGERS INFUSION - SIMPLE MED
62.5000 mL/h | INTRAVENOUS | Status: DC
  Filled 2012-09-17: qty 1000

## 2012-09-17 MED ORDER — DIBUCAINE 1 % RE OINT: 1.0000 "application " | TOPICAL_OINTMENT | RECTAL | Status: DC | PRN

## 2012-09-17 MED ORDER — OXYTOCIN BOLUS FROM INFUSION
500.0000 mL | INTRAVENOUS | Status: DC
  Administered 2012-09-17: 500 mL via INTRAVENOUS

## 2012-09-17 MED ORDER — ONDANSETRON HCL 4 MG/2ML IJ SOLN: 4.0000 mg | Freq: Four times a day (QID) | INTRAMUSCULAR | Status: DC | PRN

## 2012-09-17 MED ORDER — FENTANYL CITRATE 0.05 MG/ML IJ SOLN
100.0000 ug | INTRAMUSCULAR | Status: DC | PRN
  Administered 2012-09-17: 100 ug via INTRAVENOUS
  Filled 2012-09-17: qty 2

## 2012-09-17 MED ORDER — DIPHENHYDRAMINE HCL 25 MG PO CAPS: 25.0000 mg | ORAL_CAPSULE | Freq: Four times a day (QID) | ORAL | Status: DC | PRN

## 2012-09-17 MED ORDER — SIMETHICONE 80 MG PO CHEW: 80.0000 mg | CHEWABLE_TABLET | ORAL | Status: DC | PRN

## 2012-09-17 MED ORDER — FLEET ENEMA 7-19 GM/118ML RE ENEM: 1.0000 | ENEMA | RECTAL | Status: DC | PRN

## 2012-09-17 NOTE — Progress Notes (Signed)
Sarah Blevins is a 32 y.o. G1P0 at [redacted]w[redacted]d admitted for active labor  Subjective: Pt feeling some lower abdominal pain with contractions.  Husband at bedside at this time.   Objective: BP 118/70  Pulse 115  Temp 98.7 F (37.1 C) (Oral)  Resp 18  Ht 5\' 2"  (1.575 m)  Wt 70.081 kg (154 lb 8 oz)  BMI 28.26 kg/m2  LMP 12/13/2011      FHT:  FHR: 140 bpm, variability: moderate,  accelerations:  Present,  decelerations:  Absent UC:   regular, every 3 minutes SVE:   Dilation: 10 Effacement (%): 100 Station: +1 Exam by:: Leftwich-Kirby, CNM AROM, clear  Labs: Lab Results  Component Value Date   WBC 13.9* 09/17/2012   HGB 12.7 09/17/2012   HCT 37.4 09/17/2012   MCV 87.8 09/17/2012   PLT 193 09/17/2012    Assessment / Plan: Spontaneous labor, progressing normally  Labor: Progressing normally  Fetal Wellbeing:  Category I Pain Control:  Epidural  Anticipated MOD:  NSVD  LEFTWICH-KIRBY, Cyd Hostler 09/17/2012, 6:57 AM

## 2012-09-17 NOTE — Progress Notes (Signed)
Sarah Blevins is a 32 y.o. G1P0 at [redacted]w[redacted]d by admitted for active labor  Subjective: Patient feeling increased lower abdominal pain.  Objective: BP 126/80  Pulse 99  Temp 98.7 F (37.1 C) (Oral)  Resp 18  Ht 5\' 2"  (1.575 m)  Wt 70.081 kg (154 lb 8 oz)  BMI 28.26 kg/m2  LMP 12/13/2011      FHT:  FHR: 145 bpm, variability: moderate,  accelerations:  Abscent,  decelerations:  Absent UC:   regular, every 2 minutes SVE:   Dilation: 10 Effacement (%): 100 Station: +1 Exam by:: Leftwich-Kirby, CNM  Labs: Lab Results  Component Value Date   WBC 13.9* 09/17/2012   HGB 12.7 09/17/2012   HCT 37.4 09/17/2012   MCV 87.8 09/17/2012   PLT 193 09/17/2012    Assessment / Plan: Spontaneous labor, progressing normally - AROM at appx 0600 Labor: Progressing normally Fetal Wellbeing:  Category I Pain Control:  Epidural I/D:  n/a Anticipated MOD:  NSVD  Fatemah Pourciau 09/17/2012, 6:19 AM

## 2012-09-17 NOTE — Anesthesia Postprocedure Evaluation (Signed)
   Anesthesia Post-op Note   PAnesthesia Post Note  Patient: Sarah Blevins  Procedure(s) Performed: * No procedures listed *  Anesthesia type: Epidural  Patient location: Mother/Baby  Post pain: Pain level controlled  Post assessment: Post-op Vital signs reviewed  Last Vitals:  Filed Vitals:   09/17/12 1410  BP: 98/63  Pulse: 108  Temp: 36.7 C  Resp: 18    Post vital signs: Reviewed  Level of consciousness:alert  Complications: No apparent anesthesia complications

## 2012-09-17 NOTE — Anesthesia Preprocedure Evaluation (Signed)

## 2012-09-17 NOTE — Progress Notes (Signed)
Sarah Blevins is a 32 y.o. G1P0 pt of [facility] at [redacted]w[redacted]d admitted for active labor  Subjective: Pt comfortable with epidural.  S/O at bedside at this time.   Objective: BP 123/76  Pulse 115  Temp 98.7 F (37.1 C) (Oral)  Resp 18  Ht 5\' 2"  (1.575 m)  Wt 70.081 kg (154 lb 8 oz)  BMI 28.26 kg/m2  LMP 12/13/2011      FHT:  FHR: 140 bpm, variability: moderate,  accelerations:  Present,  decelerations:  Present early UC:   regular, every 2-3 minutes SVE:   Dilation: 6 Effacement (%): 90 Station: -1 Exam by:: [employer]  Labs: Lab Results  Component Value Date   WBC 13.9* 09/17/2012   HGB 12.7 09/17/2012   HCT 37.4 09/17/2012   MCV 87.8 09/17/2012   PLT 193 09/17/2012    Assessment / Plan: Spontaneous labor, progressing normally  Labor: Progressing normally Fetal Wellbeing:  Category I Pain Control:  Epidural Anticipated MOD:  NSVD  LEFTWICH-KIRBY, LISA 09/17/2012, 3:46 AM

## 2012-09-17 NOTE — Anesthesia Procedure Notes (Signed)
Epidural Patient location during procedure: OB Start time: 09/17/2012 2:40 AM End time: 09/17/2012 2:44 AM  Staffing Anesthesiologist: Sandrea Hughs Performed by: anesthesiologist   Preanesthetic Checklist Completed: patient identified, site marked, surgical consent, pre-op evaluation, timeout performed, IV checked, risks and benefits discussed and monitors and equipment checked  Epidural Patient position: sitting Prep: site prepped and draped and DuraPrep Patient monitoring: continuous pulse ox and blood pressure Approach: midline Injection technique: LOR air  Needle:  Needle type: Tuohy  Needle gauge: 17 G Needle length: 9 cm and 9 Needle insertion depth: 5 cm cm Catheter type: closed end flexible Catheter size: 19 Gauge Catheter at skin depth: 10 cm Test dose: negative and Other  Assessment Sensory level: T8 Events: blood not aspirated, injection not painful, no injection resistance, negative IV test and no paresthesia  Additional Notes Reason for block:procedure for pain

## 2012-09-17 NOTE — Progress Notes (Signed)
UR completed 

## 2012-09-17 NOTE — H&P (Signed)
Sarah Blevins is a 32 y.o. G1P0 at [redacted]w[redacted]d  presenting for increasing contractions. She is noting good fetal movement, has not noted any LOF or bleeding.   Maternal Medical History:  Reason for admission: Reason for admission: contractions.  Reason for Admission:   nauseaContractions: Onset was 6-12 hours ago.   Frequency: regular.   Perceived severity is moderate.    Fetal activity: Perceived fetal activity is normal.    Prenatal complications: No bleeding, hypertension or pre-eclampsia.   Prenatal Complications - Diabetes: none.    OB History    Grav Para Term Preterm Abortions TAB SAB Ect Mult Living   1              Past Medical History  Diagnosis Date  . No pertinent past medical history    Past Surgical History  Procedure Date  . Leg surgery     right leg blood cyst removal.  . Wisdom tooth extraction     x2  . No past surgeries    Family History: family history includes Anemia in her mother and Hypertension in her father. Social History:  reports that she has never smoked. She does not have any smokeless tobacco history on file. She reports that she does not drink alcohol or use illicit drugs.   Prenatal Transfer Tool  Maternal Diabetes: No Genetic Screening: Declined Maternal Ultrasounds/Referrals: Normal Fetal Ultrasounds or other Referrals:  None Maternal Substance Abuse:  No Significant Maternal Medications:  None Significant Maternal Lab Results:  None Other Comments:  None  Review of Systems  Constitutional: Negative for fever.  Eyes: Negative for blurred vision, double vision and photophobia.  Respiratory: Negative for cough.   Cardiovascular: Negative for chest pain and palpitations.  Gastrointestinal: Negative for nausea and vomiting.  Genitourinary: Negative for dysuria.  Skin: Negative for rash.  Neurological: Negative for dizziness and headaches.    Dilation: 6 Effacement (%): 90 Station: -1 Exam by:: Weston,RN Blood pressure 146/91,  pulse 97, temperature 98.7 F (37.1 C), temperature source Oral, resp. rate 18, height 5\' 2"  (1.575 m), weight 70.081 kg (154 lb 8 oz), last menstrual period 12/13/2011. Maternal Exam:  Uterine Assessment: Contraction strength is firm.  Contraction frequency is regular.   Abdomen: Fetal presentation: vertex     Fetal Exam Fetal Monitor Review: Baseline rate: 140.  Variability: moderate (6-25 bpm).   Pattern: no decelerations and no accelerations.    Fetal State Assessment: Category I - tracings are normal.     Physical Exam  Constitutional: She appears well-developed and well-nourished. She appears distressed.  HENT:  Head: Normocephalic and atraumatic.  Eyes: Conjunctivae normal and EOM are normal. Pupils are equal, round, and reactive to light.  Neck: Normal range of motion.  Cardiovascular: Normal rate, regular rhythm, normal heart sounds and intact distal pulses.   Respiratory: Effort normal and breath sounds normal.  GI:       gravid  Musculoskeletal: Normal range of motion. She exhibits no edema and no tenderness.  Neurological: She is alert.  Skin: Skin is warm and dry.  Psychiatric: She has a normal mood and affect.    Prenatal labs: ABO, Rh: B/POS/-- (06/19 1618) Antibody: NEG (06/19 1618) Rubella: 84.0 (06/19 1618) RPR: NON REAC (11/11 1421)  HBsAg: NEGATIVE (06/19 1618)  HIV: NON REACTIVE (11/11 1421)  GBS: Negative (12/18 4098)  Fetal weight at U/S in Nov 54th %ile GTT 1 hr 123  Assessment/Plan: Sarah Blevins is a 32 y.o. G1P0 at [redacted]w[redacted]d  presenting for  spontaneous labor - admit to labor and delivery - routine L&D care - patient desires epidural - anticipate SVD    CONROY, LOUISA 09/17/2012, 2:29 AM    I have seen this patient and agree with the above resident's note.  LEFTWICH-KIRBY, Charnee Turnipseed Certified Nurse-Midwife

## 2012-09-18 LAB — CBC
Hemoglobin: 9.8 g/dL — ABNORMAL LOW (ref 12.0–15.0)
Platelets: 162 10*3/uL (ref 150–400)
RBC: 3.32 MIL/uL — ABNORMAL LOW (ref 3.87–5.11)
WBC: 15 10*3/uL — ABNORMAL HIGH (ref 4.0–10.5)

## 2012-09-18 MED ORDER — IBUPROFEN 600 MG PO TABS: 600.0000 mg | ORAL_TABLET | Freq: Four times a day (QID) | ORAL | Status: DC

## 2012-09-18 NOTE — Discharge Summary (Signed)
Obstetric Discharge Summary Reason for Admission: onset of labor Prenatal Procedures: none Intrapartum Procedures: spontaneous vaginal delivery Postpartum Procedures: none Complications-Operative and Postpartum: 2nd degree perineal laceration Hemoglobin  Date Value Range Status  09/18/2012 9.8* 12.0 - 15.0 g/dL Final     REPEATED TO VERIFY     DELTA CHECK NOTED     HCT  Date Value Range Status  09/18/2012 29.2* 36.0 - 46.0 % Final    Physical Exam:  General: alert, cooperative and no distress Lochia: appropriate Uterine Fundus: firm DVT Evaluation: No evidence of DVT seen on physical exam.  Discharge Diagnoses: Term Pregnancy-delivered  Discharge Information: Date: 09/18/2012 Activity: pelvic rest Diet: routine Medications: PNV and Ibuprofen Condition: stable Instructions: refer to practice specific booklet Discharge to: home Follow-up Information    Follow up with [facility]. (Make a postpartum appointment for 4-6 weeks)    Contact information:   [address] [place] [address] [phone]         Newborn Data: Live born female  Birth Weight: 6 lb 12.6 oz (3080 g) APGAR: 9, 9  Home with mother. Breastfeeding going well; considering Nexplanon for contraception.  Cam Hai 09/18/2012, 7:48 AM Post Partum Day 1 Subjective: no complaints, up ad lib, voiding, tolerating PO and + flatus. Initially wanted to go home, was discharged, but baby had to stay secondary to jaundice. Discharge cancelled, postponed until 09/19/12.  Objective: Blood pressure 114/71, pulse 94, temperature 97.8 F (36.6 C), temperature source Oral, resp. rate 18, height 5\' 2"  (1.575 m), weight 154 lb 8 oz (70.081 kg), last menstrual period 12/13/2011, SpO2 97.00%, unknown if currently breastfeeding.  Physical Exam:  General: cooperative and no distress Lochia: appropriate Uterine Fundus: firm DVT Evaluation: No evidence of DVT  seen on physical exam. Negative Homan's sign.   Basename 09/18/12 0522 09/17/12 0055  HGB 9.8* 12.7  HCT 29.2* 37.4    Assessment/Plan: Plan for discharge tomorrow and Breastfeeding   LOS: 2 days   Brenten Janney A 09/18/2012, 7:39 AM

## 2012-09-19 NOTE — Progress Notes (Signed)
Post Partum Day 1  Subjective:  no complaints, up ad lib, voiding, tolerating PO and + flatus. Initially wanted to go home, was discharged, but baby had to stay secondary to jaundice. Discharge cancelled, postponed until 09/19/12.   Objective:  Blood pressure 114/71, pulse 94, temperature 97.8 F (36.6 C), temperature source Oral, resp. rate 18, height 5\' 2"  (1.575 m), weight 154 lb 8 oz (70.081 kg), last menstrual period 12/13/2011, SpO2 97.00%, unknown if currently breastfeeding.   Physical Exam:  General: cooperative and no distress  Lochia: appropriate  Uterine Fundus: firm  DVT Evaluation: No evidence of DVT seen on physical exam.  Negative Homan's sign.   Basename  09/18/12 0522  09/17/12 0055   HGB  9.8*  12.7   HCT  29.2*  37.4    Assessment/Plan:  Plan for discharge tomorrow and Breastfeeding  LOS: 2 days   Cyruss Arata A  09/18/2012, 7:39 AM

## 2012-09-19 NOTE — Discharge Summary (Signed)
Obstetric Discharge Summary Reason for Admission: onset of labor Prenatal Procedures: none Intrapartum Procedures: spontaneous vaginal delivery Postpartum Procedures: none Complications-Operative and Postpartum: 2nd degree perineal laceration Hemoglobin  Date Value Range Status  09/18/2012 9.8* 12.0 - 15.0 g/dL Final     REPEATED TO VERIFY     DELTA CHECK NOTED     HCT  Date Value Range Status  09/18/2012 29.2* 36.0 - 46.0 % Final    Physical Exam:  BP 114/71  Pulse 94  Temp 97.8 F (36.6 C) (Oral)  Resp 18  Ht 5\' 2"  (1.575 m)  Wt 154 lb 8 oz (70.081 kg)  BMI 28.26 kg/m2  SpO2 97%  LMP 12/13/2011  Breastfeeding? Unknown General: alert, cooperative and no distress Lochia: appropriate Uterine Fundus: firm DVT Evaluation: No evidence of DVT seen on physical exam.  Discharge Diagnoses: Term Pregnancy-delivered  Discharge Information: Date: 09/19/2012 Activity: pelvic rest Diet: routine Medications: PNV and Ibuprofen Condition: stable Instructions: refer to practice specific booklet Discharge to: home Follow-up Information    [facility] up with [facility]. (Make a postpartum appointment for 4-6 weeks)    Contact information:   [address] [place] [address] [phone]         Newborn Data: Live born female  Birth Weight: 6 lb 12.6 oz (3080 g) APGAR: 9, 9  Home with mother. Breastfeeding going well; considering Nexplanon for contraception.  ANYANWU,UGONNA A 09/19/2012, 7:38 AM

## 2012-09-25 ENCOUNTER — Inpatient Hospital Stay (HOSPITAL_COMMUNITY): Payer: BC Managed Care – PPO

## 2012-09-30 NOTE — Progress Notes (Signed)
I have seen this patient and agree with the above resident's note.  LEFTWICH-KIRBY, Ranbir Chew Certified Nurse-Midwife 

## 2014-01-10 ENCOUNTER — Ambulatory Visit (INDEPENDENT_AMBULATORY_CARE_PROVIDER_SITE_OTHER): Payer: Medicaid Other | Admitting: Family Medicine

## 2014-01-10 ENCOUNTER — Encounter: Payer: Self-pay | Admitting: Family Medicine

## 2014-01-10 VITALS — BP 106/63 | HR 76 | Wt 118.0 lb

## 2014-01-10 DIAGNOSIS — Z3480 Encounter for supervision of other normal pregnancy, unspecified trimester: Secondary | ICD-10-CM

## 2014-01-10 DIAGNOSIS — L293 Anogenital pruritus, unspecified: Secondary | ICD-10-CM

## 2014-01-10 DIAGNOSIS — IMO0002 Reserved for concepts with insufficient information to code with codable children: Secondary | ICD-10-CM

## 2014-01-10 DIAGNOSIS — Z3490 Encounter for supervision of normal pregnancy, unspecified, unspecified trimester: Secondary | ICD-10-CM

## 2014-01-10 DIAGNOSIS — Z1151 Encounter for screening for human papillomavirus (HPV): Secondary | ICD-10-CM

## 2014-01-10 DIAGNOSIS — N898 Other specified noninflammatory disorders of vagina: Secondary | ICD-10-CM

## 2014-01-10 DIAGNOSIS — Z113 Encounter for screening for infections with a predominantly sexual mode of transmission: Secondary | ICD-10-CM

## 2014-01-10 DIAGNOSIS — Z124 Encounter for screening for malignant neoplasm of cervix: Secondary | ICD-10-CM

## 2014-01-10 DIAGNOSIS — R6889 Other general symptoms and signs: Secondary | ICD-10-CM

## 2014-01-10 MED ORDER — FLUCONAZOLE 150 MG PO TABS: 150.0000 mg | ORAL_TABLET | Freq: Once | ORAL | Status: DC

## 2014-01-10 NOTE — Patient Instructions (Signed)
Second Trimester of Pregnancy The second trimester is from week 13 through week 28, months 4 through 6. The second trimester is often a time when you feel your best. Your body has also adjusted to being pregnant, and you begin to feel better physically. Usually, morning sickness has lessened or quit completely, you may have more energy, and you may have an increase in appetite. The second trimester is also a time when the fetus is growing rapidly. At the end of the sixth month, the fetus is about 9 inches long and weighs about 1 pounds. You will likely begin to feel the baby move (quickening) between 18 and 20 weeks of the pregnancy. BODY CHANGES Your body goes through many changes during pregnancy. The changes vary from woman to woman.   Your weight will continue to increase. You will notice your lower abdomen bulging out.  You may begin to get stretch marks on your hips, abdomen, and breasts.  You may develop headaches that can be relieved by medicines approved by your caregiver.  You may urinate more often because the fetus is pressing on your bladder.  You may develop or continue to have heartburn as a result of your pregnancy.  You may develop constipation because certain hormones are causing the muscles that push waste through your intestines to slow down.  You may develop hemorrhoids or swollen, bulging veins (varicose veins).  You may have back pain because of the weight gain and pregnancy hormones relaxing your joints between the bones in your pelvis and as a result of a shift in weight and the muscles that support your balance.  Your breasts will continue to grow and be tender.  Your gums may bleed and may be sensitive to brushing and flossing.  Dark spots or blotches (chloasma, mask of pregnancy) may develop on your face. This will likely fade after the baby is born.  A dark line from your belly button to the pubic area (linea nigra) may appear. This will likely fade after  the baby is born. WHAT TO EXPECT AT YOUR PRENATAL VISITS During a routine prenatal visit:  You will be weighed to make sure you and the fetus are growing normally.  Your blood pressure will be taken.  Your abdomen will be measured to track your baby's growth.  The fetal heartbeat will be listened to.  Any test results from the previous visit will be discussed. Your caregiver may ask you:  How you are feeling.  If you are feeling the baby move.  If you have had any abnormal symptoms, such as leaking fluid, bleeding, severe headaches, or abdominal cramping.  If you have any questions. Other tests that may be performed during your second trimester include:  Blood tests that check for:  Low iron levels (anemia).  Gestational diabetes (between 24 and 28 weeks).  Rh antibodies.  Urine tests to check for infections, diabetes, or protein in the urine.  An ultrasound to confirm the proper growth and development of the baby.  An amniocentesis to check for possible genetic problems.  Fetal screens for spina bifida and Down syndrome. HOME CARE INSTRUCTIONS   Avoid all smoking, herbs, alcohol, and unprescribed drugs. These chemicals affect the formation and growth of the baby.  Follow your caregiver's instructions regarding medicine use. There are medicines that are either safe or unsafe to take during pregnancy.  Exercise only as directed by your caregiver. Experiencing uterine cramps is a good sign to stop exercising.  Continue to eat regular,   healthy meals.  Wear a good support bra for breast tenderness.  Do not use hot tubs, steam rooms, or saunas.  Wear your seat belt at all times when driving.  Avoid raw meat, uncooked cheese, cat litter boxes, and soil used by cats. These carry germs that can cause birth defects in the baby.  Take your prenatal vitamins.  Try taking a stool softener (if your caregiver approves) if you develop constipation. Eat more high-fiber  foods, such as fresh vegetables or fruit and whole grains. Drink plenty of fluids to keep your urine clear or pale yellow.  Take warm sitz baths to soothe any pain or discomfort caused by hemorrhoids. Use hemorrhoid cream if your caregiver approves.  If you develop varicose veins, wear support hose. Elevate your feet for 15 minutes, 3 4 times a day. Limit salt in your diet.  Avoid heavy lifting, wear low heel shoes, and practice good posture.  Rest with your legs elevated if you have leg cramps or low back pain.  Visit your dentist if you have not gone yet during your pregnancy. Use a soft toothbrush to brush your teeth and be gentle when you floss.  A sexual relationship may be continued unless your caregiver directs you otherwise.  Continue to go to all your prenatal visits as directed by your caregiver. SEEK MEDICAL CARE IF:   You have dizziness.  You have mild pelvic cramps, pelvic pressure, or nagging pain in the abdominal area.  You have persistent nausea, vomiting, or diarrhea.  You have a bad smelling vaginal discharge.  You have pain with urination. SEEK IMMEDIATE MEDICAL CARE IF:   You have a fever.  You are leaking fluid from your vagina.  You have spotting or bleeding from your vagina.  You have severe abdominal cramping or pain.  You have rapid weight gain or loss.  You have shortness of breath with chest pain.  You notice sudden or extreme swelling of your face, hands, ankles, feet, or legs.  You have not felt your baby move in over an hour.  You have severe headaches that do not go away with medicine.  You have vision changes. Document Released: 08/19/2001 Document Revised: 04/27/2013 Document Reviewed: 10/26/2012 [employer] Patient Information 2014 [employer].  Breastfeeding Deciding to breastfeed is one of the best choices you can make for you and your baby. A change in hormones during pregnancy causes your breast tissue to grow and increases the  number and size of your milk ducts. These hormones also allow proteins, sugars, and fats from your blood supply to make breast milk in your milk-producing glands. Hormones prevent breast milk from being released before your baby is born as well as prompt milk flow after birth. Once breastfeeding has begun, thoughts of your baby, as well as his or her sucking or crying, can stimulate the release of milk from your milk-producing glands.  BENEFITS OF BREASTFEEDING For Your Baby  Your first milk (colostrum) helps your baby's digestive system function better.   There are antibodies in your milk that help your baby fight off infections.   Your baby has a lower incidence of asthma, allergies, and sudden infant death syndrome.   The nutrients in breast milk are better for your baby than infant formulas and are designed uniquely for your baby's needs.   Breast milk improves your baby's brain development.   Your baby is less likely to develop other conditions, such as childhood obesity, asthma, or type 2 diabetes mellitus.  For   You   Breastfeeding helps to create a very special bond between you and your baby.   Breastfeeding is convenient. Breast milk is always available at the correct temperature and costs nothing.   Breastfeeding helps to burn calories and helps you lose the weight gained during pregnancy.   Breastfeeding makes your uterus contract to its prepregnancy size faster and slows bleeding (lochia) after you give birth.   Breastfeeding helps to lower your risk of developing type 2 diabetes mellitus, osteoporosis, and breast or ovarian cancer later in life. SIGNS THAT YOUR BABY IS HUNGRY Early Signs of Hunger  Increased alertness or activity.  Stretching.  Movement of the head from side to side.  Movement of the head and opening of the mouth when the corner of the mouth or cheek is stroked (rooting).  Increased sucking sounds, smacking lips, cooing, sighing, or  squeaking.  Hand-to-mouth movements.  Increased sucking of fingers or hands. Late Signs of Hunger  Fussing.  Intermittent crying. Extreme Signs of Hunger Signs of extreme hunger will require calming and consoling before your baby will be able to breastfeed successfully. Do not wait for the following signs of extreme hunger to occur before you initiate breastfeeding:   Restlessness.  A loud, strong cry.   Screaming. BREASTFEEDING BASICS Breastfeeding Initiation  Find a comfortable place to sit or lie down, with your neck and back well supported.  Place a pillow or rolled up blanket under your baby to bring him or her to the level of your breast (if you are seated). Nursing pillows are specially designed to help support your arms and your baby while you breastfeed.  Make sure that your baby's abdomen is facing your abdomen.   Gently massage your breast. With your fingertips, massage from your chest wall toward your nipple in a circular motion. This encourages milk flow. You may need to continue this action during the feeding if your milk flows slowly.  Support your breast with 4 fingers underneath and your thumb above your nipple. Make sure your fingers are well away from your nipple and your baby's mouth.   Stroke your baby's lips gently with your finger or nipple.   When your baby's mouth is open wide enough, quickly bring your baby to your breast, placing your entire nipple and as much of the colored area around your nipple (areola) as possible into your baby's mouth.   More areola should be visible above your baby's upper lip than below the lower lip.   Your baby's tongue should be between his or her lower gum and your breast.   Ensure that your baby's mouth is correctly positioned around your nipple (latched). Your baby's lips should create a seal on your breast and be turned out (everted).  It is common for your baby to suck about 2 3 minutes in order to start the  flow of breast milk. Latching Teaching your baby how to latch on to your breast properly is very important. An improper latch can cause nipple pain and decreased milk supply for you and poor weight gain in your baby. Also, if your baby is not latched onto your nipple properly, he or she may swallow some air during feeding. This can make your baby fussy. Burping your baby when you switch breasts during the feeding can help to get rid of the air. However, teaching your baby to latch on properly is still the best way to prevent fussiness from swallowing air while breastfeeding. Signs that your baby has   successfully latched on to your nipple:    Silent tugging or silent sucking, without causing you pain.   Swallowing heard between every 3 4 sucks.    Muscle movement above and in front of his or her ears while sucking.  Signs that your baby has not successfully latched on to nipple:   Sucking sounds or smacking sounds from your baby while breastfeeding.  Nipple pain. If you think your baby has not latched on correctly, slip your finger into the corner of your baby's mouth to break the suction and place it between your baby's gums. Attempt breastfeeding initiation again. Signs of Successful Breastfeeding Signs from your baby:   A gradual decrease in the number of sucks or complete cessation of sucking.   Falling asleep.   Relaxation of his or her body.   Retention of a small amount of milk in his or her mouth.   Letting go of your breast by himself or herself. Signs from you:  Breasts that have increased in firmness, weight, and size 1 3 hours after feeding.   Breasts that are softer immediately after breastfeeding.  Increased milk volume, as well as a change in milk consistency and color by the 5th day of breastfeeding.   Nipples that are not sore, cracked, or bleeding. Signs That Your Baby is Getting Enough Milk  Wetting at least 3 diapers in a 24-hour period. The urine  should be clear and pale yellow by age 5 days.  At least 3 stools in a 24-hour period by age 5 days. The stool should be soft and yellow.  At least 3 stools in a 24-hour period by age 7 days. The stool should be seedy and yellow.  No loss of weight greater than 10% of birth weight during the first 3 days of age.  Average weight gain of 4 7 ounces (120 210 mL) per week after age 4 days.  Consistent daily weight gain by age 5 days, without weight loss after the age of 2 weeks. After a feeding, your baby may spit up a small amount. This is common. BREASTFEEDING FREQUENCY AND DURATION Frequent feeding will help you make more milk and can prevent sore nipples and breast engorgement. Breastfeed when you feel the need to reduce the fullness of your breasts or when your baby shows signs of hunger. This is called "breastfeeding on demand." Avoid introducing a pacifier to your baby while you are working to establish breastfeeding (the first 4 6 weeks after your baby is born). After this time you may choose to use a pacifier. Research has shown that pacifier use during the first year of a baby's life decreases the risk of sudden infant death syndrome (SIDS). Allow your baby to feed on each breast as long as he or she wants. Breastfeed until your baby is finished feeding. When your baby unlatches or falls asleep while feeding from the first breast, offer the second breast. Because newborns are often sleepy in the first few weeks of life, you may need to awaken your baby to get him or her to feed. Breastfeeding times will vary from baby to baby. However, the following rules can serve as a guide to help you ensure that your baby is properly fed:  Newborns (babies 4 weeks of age or younger) may breastfeed every 1 3 hours.  Newborns should not go longer than 3 hours during the day or 5 hours during the night without breastfeeding.  You should breastfeed your baby a minimum of   8 times in a 24-hour period until  you begin to introduce solid foods to your baby at around 6 months of age. BREAST MILK PUMPING Pumping and storing breast milk allows you to ensure that your baby is exclusively fed your breast milk, even at times when you are unable to breastfeed. This is especially important if you are going back to work while you are still breastfeeding or when you are not able to be present during feedings. Your lactation consultant can give you guidelines on how long it is safe to store breast milk.  A breast pump is a machine that allows you to pump milk from your breast into a sterile bottle. The pumped breast milk can then be stored in a refrigerator or freezer. Some breast pumps are operated by hand, while others use electricity. Ask your lactation consultant which type will work best for you. Breast pumps can be purchased, but some hospitals and breastfeeding support groups lease breast pumps on a monthly basis. A lactation consultant can teach you how to hand express breast milk, if you prefer not to use a pump.  CARING FOR YOUR BREASTS WHILE YOU BREASTFEED Nipples can become dry, cracked, and sore while breastfeeding. The following recommendations can help keep your breasts moisturized and healthy:  Avoid using soap on your nipples.   Wear a supportive bra. Although not required, special nursing bras and tank tops are designed to allow access to your breasts for breastfeeding without taking off your entire bra or top. Avoid wearing underwire style bras or extremely tight bras.  Air dry your nipples for 3 4minutes after each feeding.   Use only cotton bra pads to absorb leaked breast milk. Leaking of breast milk between feedings is normal.   Use lanolin on your nipples after breastfeeding. Lanolin helps to maintain your skin's normal moisture barrier. If you use pure lanolin you do not need to wash it off before feeding your baby again. Pure lanolin is not toxic to your baby. You may also hand express a  few drops of breast milk and gently massage that milk into your nipples and allow the milk to air dry. In the first few weeks after giving birth, some women experience extremely full breasts (engorgement). Engorgement can make your breasts feel heavy, warm, and tender to the touch. Engorgement peaks within 3 5 days after you give birth. The following recommendations can help ease engorgement:  Completely empty your breasts while breastfeeding or pumping. You may want to start by applying warm, moist heat (in the shower or with warm water-soaked hand towels) just before feeding or pumping. This increases circulation and helps the milk flow. If your baby does not completely empty your breasts while breastfeeding, pump any extra milk after he or she is finished.  Wear a snug bra (nursing or regular) or tank top for 1 2 days to signal your body to slightly decrease milk production.  Apply ice packs to your breasts, unless this is too uncomfortable for you.  Make sure that your baby is latched on and positioned properly while breastfeeding. If engorgement persists after 48 hours of following these recommendations, contact your health care provider or a lactation consultant. OVERALL HEALTH CARE RECOMMENDATIONS WHILE BREASTFEEDING  Eat healthy foods. Alternate between meals and snacks, eating 3 of each per day. Because what you eat affects your breast milk, some of the foods may make your baby more irritable than usual. Avoid eating these foods if you are sure that they are   negatively affecting your baby.  Drink milk, fruit juice, and water to satisfy your thirst (about 10 glasses a day).   Rest often, relax, and continue to take your prenatal vitamins to prevent fatigue, stress, and anemia.  Continue breast self-awareness checks.  Avoid chewing and smoking tobacco.  Avoid alcohol and drug use. Some medicines that may be harmful to your baby can pass through breast milk. It is important to ask your  health care provider before taking any medicine, including all over-the-counter and prescription medicine as well as vitamin and herbal supplements. It is possible to become pregnant while breastfeeding. If birth control is desired, ask your health care provider about options that will be safe for your baby. SEEK MEDICAL CARE IF:   You feel like you want to stop breastfeeding or have become frustrated with breastfeeding.  You have painful breasts or nipples.  Your nipples are cracked or bleeding.  Your breasts are red, tender, or warm.  You have a swollen area on either breast.  You have a fever or chills.  You have nausea or vomiting.  You have drainage other than breast milk from your nipples.  Your breasts do not become full before feedings by the 5th day after you give birth.  You feel sad and depressed.  Your baby is too sleepy to eat well.  Your baby is having trouble sleeping.   Your baby is wetting less than 3 diapers in a 24-hour period.  Your baby has less than 3 stools in a 24-hour period.  Your baby's skin or the white part of his or her eyes becomes yellow.   Your baby is not gaining weight by 5 days of age. SEEK IMMEDIATE MEDICAL CARE IF:   Your baby is overly tired (lethargic) and does not want to wake up and feed.  Your baby develops an unexplained fever. Document Released: 08/25/2005 Document Revised: 04/27/2013 Document Reviewed: 02/16/2013 [employer] Patient Information 2014 [employer].  

## 2014-01-10 NOTE — Progress Notes (Signed)
   Subjective:    Sarah Blevins is a G2P1001 [id number] being seen today for her first obstetrical visit.  Her obstetrical history is significant for continues to nurse first child. Patient does intend to breast feed. Pregnancy history fully reviewed.  Patient reports no complaints.  Filed Vitals:   01/10/14 1507  BP: 106/63  Pulse: 76  Weight: 118 lb (53.524 kg)    HISTORY: OB History  Gravida Para Term Preterm AB SAB TAB Ectopic Multiple Living  2 1 1  0 0 0 0 0 0 1    # Outcome Date GA Lbr Len/2nd Weight Sex Delivery Anes PTL Lv  2 CUR           1 TRM 09/17/12 806w6d 12:45 / 03:55 6 lb 12.6 oz (3.08 kg) F SVD EPI  Y     Comments: na     Past Medical History  Diagnosis Date  . No pertinent past medical history    Past Surgical History  Procedure Laterality Date  . Leg surgery      right leg blood cyst removal.  . Wisdom tooth extraction      x2  . No past surgeries     Family History  Problem Relation Age of Onset  . Anemia Mother   . Hypertension Father      Exam    Uterus:   16 wk size  Pelvic Exam:    Perineum: Normal Perineum   Vulva: Bartholin's, Urethra, Skene's normal   Vagina:  normal mucosa, normal discharge   Cervix: multiparous appearance   Adnexa: normal adnexa   Bony Pelvis: average  System: Breast:  normal appearance, no masses or tenderness   Skin: normal coloration and turgor, no rashes    Neurologic: oriented   Extremities: normal strength, tone, and muscle mass   HEENT sclera clear, anicteric   Mouth/Teeth mucous membranes moist, pharynx normal without lesions   Neck supple   Cardiovascular: regular rate and rhythm, no murmurs or gallops   Respiratory:  appears well, vitals normal, no respiratory distress, acyanotic, normal RR, ear and throat exam is normal, neck free of mass or lymphadenopathy, chest clear, no wheezing, crepitations, rhonchi, normal symmetric air entry   Abdomen: soft, non-tender; bowel sounds normal; no masses,  no  organomegaly   Limited us shows living single IUP.  BPD is 16 w 3 days. + fetal movement and + FHR.   Assessment:    Pregnancy: G2P1001 Patient Active Problem List   Diagnosis Date Noted  . Supervision of normal pregnancy 03/04/2012    Priority: High  . 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 Prenatal vitamins. Problem list reviewed and updated. Genetic Screening discussed Quad Screen: declined. Ultrasound discussed; fetal survey: ordered for anatomy Follow up in 4 weeks. Repeat pap--last was ASCUS with + HR HPV--no f/u   Reva Boresanya S Isa Kohlenberg 01/10/2014

## 2014-01-11 LAB — OBSTETRIC PANEL
Antibody Screen: NEGATIVE
BASOS PCT: 0 % (ref 0–1)
Basophils Absolute: 0 10*3/uL (ref 0.0–0.1)
EOS ABS: 0.3 10*3/uL (ref 0.0–0.7)
EOS PCT: 3 % (ref 0–5)
HCT: 34.6 % — ABNORMAL LOW (ref 36.0–46.0)
HEP B S AG: NEGATIVE
Hemoglobin: 11.8 g/dL — ABNORMAL LOW (ref 12.0–15.0)
Lymphocytes Relative: 22 % (ref 12–46)
Lymphs Abs: 1.8 10*3/uL (ref 0.7–4.0)
MCH: 29.9 pg (ref 26.0–34.0)
MCHC: 34.1 g/dL (ref 30.0–36.0)
MCV: 87.6 fL (ref 78.0–100.0)
Monocytes Absolute: 0.5 10*3/uL (ref 0.1–1.0)
Monocytes Relative: 6 % (ref 3–12)
NEUTROS PCT: 69 % (ref 43–77)
Neutro Abs: 5.8 10*3/uL (ref 1.7–7.7)
PLATELETS: 224 10*3/uL (ref 150–400)
RBC: 3.95 MIL/uL (ref 3.87–5.11)
RDW: 13.8 % (ref 11.5–15.5)
RH TYPE: POSITIVE
Rubella: 5.32 Index — ABNORMAL HIGH (ref <0.90)
WBC: 8.4 10*3/uL (ref 4.0–10.5)

## 2014-01-11 LAB — WET PREP, GENITAL
CLUE CELLS WET PREP: NONE SEEN
TRICH WET PREP: NONE SEEN

## 2014-01-11 LAB — CULTURE, OB URINE
COLONY COUNT: NO GROWTH
ORGANISM ID, BACTERIA: NO GROWTH

## 2014-01-11 LAB — HIV ANTIBODY (ROUTINE TESTING W REFLEX): HIV: NONREACTIVE

## 2014-02-03 ENCOUNTER — Ambulatory Visit (HOSPITAL_COMMUNITY): Payer: Medicaid Other

## 2014-02-10 ENCOUNTER — Ambulatory Visit (INDEPENDENT_AMBULATORY_CARE_PROVIDER_SITE_OTHER): Payer: Medicaid Other | Admitting: Family Medicine

## 2014-02-10 ENCOUNTER — Encounter: Payer: Self-pay | Admitting: Family Medicine

## 2014-02-10 VITALS — BP 90/62 | HR 79 | Wt 124.8 lb

## 2014-02-10 DIAGNOSIS — Z3490 Encounter for supervision of normal pregnancy, unspecified, unspecified trimester: Secondary | ICD-10-CM

## 2014-02-10 DIAGNOSIS — Z3480 Encounter for supervision of other normal pregnancy, unspecified trimester: Secondary | ICD-10-CM

## 2014-02-10 NOTE — Patient Instructions (Addendum)
You can take Mucinex, (Allegra, Claritin, Zyrtec-any one of these--they are the same class--same as Benadryl), sudafed, saline nasal spray, and steroid nasal sprays like Flonase or Nasocort.  Second Trimester of Pregnancy The second trimester is from week 13 through week 28, months 4 through 6. The second trimester is often a time when you feel your best. Your body has also adjusted to being pregnant, and you begin to feel better physically. Usually, morning sickness has lessened or quit completely, you may have more energy, and you may have an increase in appetite. The second trimester is also a time when the fetus is growing rapidly. At the end of the sixth month, the fetus is about 9 inches long and weighs about 1 pounds. You will likely begin to feel the baby move (quickening) between 18 and 20 weeks of the pregnancy. BODY CHANGES Your body goes through many changes during pregnancy. The changes vary from woman to woman.   Your weight will continue to increase. You will notice your lower abdomen bulging out.  You may begin to get stretch marks on your hips, abdomen, and breasts.  You may develop headaches that can be relieved by medicines approved by your caregiver.  You may urinate more often because the fetus is pressing on your bladder.  You may develop or continue to have heartburn as a result of your pregnancy.  You may develop constipation because certain hormones are causing the muscles that push waste through your intestines to slow down.  You may develop hemorrhoids or swollen, bulging veins (varicose veins).  You may have back pain because of the weight gain and pregnancy hormones relaxing your joints between the bones in your pelvis and as a result of a shift in weight and the muscles that support your balance.  Your breasts will continue to grow and be tender.  Your gums may bleed and may be sensitive to brushing and flossing.  Dark spots or blotches (chloasma, mask of  pregnancy) may develop on your face. This will likely fade after the baby is born.  A dark line from your belly button to the pubic area (linea nigra) may appear. This will likely fade after the baby is born. WHAT TO EXPECT AT YOUR PRENATAL VISITS During a routine prenatal visit:  You will be weighed to make sure you and the fetus are growing normally.  Your blood pressure will be taken.  Your abdomen will be measured to track your baby's growth.  The fetal heartbeat will be listened to.  Any test results from the previous visit will be discussed. Your caregiver may ask you:  How you are feeling.  If you are feeling the baby move.  If you have had any abnormal symptoms, such as leaking fluid, bleeding, severe headaches, or abdominal cramping.  If you have any questions. Other tests that may be performed during your second trimester include:  Blood tests that check for:  Low iron levels (anemia).  Gestational diabetes (between 24 and 28 weeks).  Rh antibodies.  Urine tests to check for infections, diabetes, or protein in the urine.  An ultrasound to confirm the proper growth and development of the baby.  An amniocentesis to check for possible genetic problems.  Fetal screens for spina bifida and Down syndrome. HOME CARE INSTRUCTIONS   Avoid all smoking, herbs, alcohol, and unprescribed drugs. These chemicals affect the formation and growth of the baby.  Follow your caregiver's instructions regarding medicine use. There are medicines that are either safe  or unsafe to take during pregnancy.  Exercise only as directed by your caregiver. Experiencing uterine cramps is a good sign to stop exercising.  Continue to eat regular, healthy meals.  Wear a good support bra for breast tenderness.  Do not use hot tubs, steam rooms, or saunas.  Wear your seat belt at all times when driving.  Avoid raw meat, uncooked cheese, cat litter boxes, and soil used by cats. These carry  germs that can cause birth defects in the baby.  Take your prenatal vitamins.  Try taking a stool softener (if your caregiver approves) if you develop constipation. Eat more high-fiber foods, such as fresh vegetables or fruit and whole grains. Drink plenty of fluids to keep your urine clear or pale yellow.  Take warm sitz baths to soothe any pain or discomfort caused by hemorrhoids. Use hemorrhoid cream if your caregiver approves.  If you develop varicose veins, wear support hose. Elevate your feet for 15 minutes, 3 4 times a day. Limit salt in your diet.  Avoid heavy lifting, wear low heel shoes, and practice good posture.  Rest with your legs elevated if you have leg cramps or low back pain.  Visit your dentist if you have not gone yet during your pregnancy. Use a soft toothbrush to brush your teeth and be gentle when you floss.  A sexual relationship may be continued unless your caregiver directs you otherwise.  Continue to go to all your prenatal visits as directed by your caregiver. SEEK MEDICAL CARE IF:   You have dizziness.  You have mild pelvic cramps, pelvic pressure, or nagging pain in the abdominal area.  You have persistent nausea, vomiting, or diarrhea.  You have a bad smelling vaginal discharge.  You have pain with urination. SEEK IMMEDIATE MEDICAL CARE IF:   You have a fever.  You are leaking fluid from your vagina.  You have spotting or bleeding from your vagina.  You have severe abdominal cramping or pain.  You have rapid weight gain or loss.  You have shortness of breath with chest pain.  You notice sudden or extreme swelling of your face, hands, ankles, feet, or legs.  You have not felt your baby move in over an hour.  You have severe headaches that do not go away with medicine.  You have vision changes. Document Released: 08/19/2001 Document Revised: 04/27/2013 Document Reviewed: 10/26/2012 [facility] Patient Information 2014 [place],  [place].  Breastfeeding Deciding to breastfeed is one of the best choices you can make for you and your baby. A change in hormones during pregnancy causes your breast tissue to grow and increases the number and size of your milk ducts. These hormones also allow proteins, sugars, and fats from your blood supply to make breast milk in your milk-producing glands. Hormones prevent breast milk from being released before your baby is born as well as prompt milk flow after birth. Once breastfeeding has begun, thoughts of your baby, as well as his or her sucking or crying, can stimulate the release of milk from your milk-producing glands.  BENEFITS OF BREASTFEEDING For Your Baby  Your first milk (colostrum) helps your baby's digestive system function better.   There are antibodies in your milk that help your baby fight off infections.   Your baby has a lower incidence of asthma, allergies, and sudden infant death syndrome.   The nutrients in breast milk are better for your baby than infant formulas and are designed uniquely for your baby's needs.   Breast  milk improves your baby's brain development.   Your baby is less likely to develop other conditions, such as childhood obesity, asthma, or type 2 diabetes mellitus.  For You   Breastfeeding helps to create a very special bond between you and your baby.   Breastfeeding is convenient. Breast milk is always available at the correct temperature and costs nothing.   Breastfeeding helps to burn calories and helps you lose the weight gained during pregnancy.   Breastfeeding makes your uterus contract to its prepregnancy size faster and slows bleeding (lochia) after you give birth.   Breastfeeding helps to lower your risk of developing type 2 diabetes mellitus, osteoporosis, and breast or ovarian cancer later in life. SIGNS THAT YOUR BABY IS HUNGRY Early Signs of Hunger  Increased alertness or activity.  Stretching.  Movement of the head  from side to side.  Movement of the head and opening of the mouth when the corner of the mouth or cheek is stroked (rooting).  Increased sucking sounds, smacking lips, cooing, sighing, or squeaking.  Hand-to-mouth movements.  Increased sucking of fingers or hands. Late Signs of Hunger  Fussing.  Intermittent crying. Extreme Signs of Hunger Signs of extreme hunger will require calming and consoling before your baby will be able to breastfeed successfully. Do not wait for the following signs of extreme hunger to occur before you initiate breastfeeding:   Restlessness.  A loud, strong cry.   Screaming. BREASTFEEDING BASICS Breastfeeding Initiation  Find a comfortable place to sit or lie down, with your neck and back well supported.  Place a pillow or rolled up blanket under your baby to bring him or her to the level of your breast (if you are seated). Nursing pillows are specially designed to help support your arms and your baby while you breastfeed.  Make sure that your baby's abdomen is facing your abdomen.   Gently massage your breast. With your fingertips, massage from your chest wall toward your nipple in a circular motion. This encourages milk flow. You may need to continue this action during the feeding if your milk flows slowly.  Support your breast with 4 fingers underneath and your thumb above your nipple. Make sure your fingers are well away from your nipple and your baby's mouth.   Stroke your baby's lips gently with your finger or nipple.   When your baby's mouth is open wide enough, quickly bring your baby to your breast, placing your entire nipple and as much of the colored area around your nipple (areola) as possible into your baby's mouth.   More areola should be visible above your baby's upper lip than below the lower lip.   Your baby's tongue should be between his or her lower gum and your breast.   Ensure that your baby's mouth is correctly  positioned around your nipple (latched). Your baby's lips should create a seal on your breast and be turned out (everted).  It is common for your baby to suck about 2 3 minutes in order to start the flow of breast milk. Latching Teaching your baby how to latch on to your breast properly is very important. An improper latch can cause nipple pain and decreased milk supply for you and poor weight gain in your baby. Also, if your baby is not latched onto your nipple properly, he or she may swallow some air during feeding. This can make your baby fussy. Burping your baby when you switch breasts during the feeding can help to get rid  of the air. However, teaching your baby to latch on properly is still the best way to prevent fussiness from swallowing air while breastfeeding. Signs that your baby has successfully latched on to your nipple:    Silent tugging or silent sucking, without causing you pain.   Swallowing heard between every 3 4 sucks.    Muscle movement above and in front of his or her ears while sucking.  Signs that your baby has not successfully latched on to nipple:   Sucking sounds or smacking sounds from your baby while breastfeeding.  Nipple pain. If you think your baby has not latched on correctly, slip your finger into the corner of your baby's mouth to break the suction and place it between your baby's gums. Attempt breastfeeding initiation again. Signs of Successful Breastfeeding Signs from your baby:   A gradual decrease in the number of sucks or complete cessation of sucking.   Falling asleep.   Relaxation of his or her body.   Retention of a small amount of milk in his or her mouth.   Letting go of your breast by himself or herself. Signs from you:  Breasts that have increased in firmness, weight, and size 1 3 hours after feeding.   Breasts that are softer immediately after breastfeeding.  Increased milk volume, as well as a change in milk consistency  and color by the 5th day of breastfeeding.   Nipples that are not sore, cracked, or bleeding. Signs That Your Pecola LeisureBaby is Getting Enough Milk  Wetting at least 3 diapers in a 24-hour period. The urine should be clear and pale yellow by age 18 days.  At least 3 stools in a 24-hour period by age 18 days. The stool should be soft and yellow.  At least 3 stools in a 24-hour period by age 202 days. The stool should be seedy and yellow.  No loss of weight greater than 10% of birth weight during the first 93 days of age.  Average weight gain of 4 7 ounces (120 210 mL) per week after age 202 days.  Consistent daily weight gain by age 18 days, without weight loss after the age of 2 weeks. After a feeding, your baby may spit up a small amount. This is common. BREASTFEEDING FREQUENCY AND DURATION Frequent feeding will help you make more milk and can prevent sore nipples and breast engorgement. Breastfeed when you feel the need to reduce the fullness of your breasts or when your baby shows signs of hunger. This is called "breastfeeding on demand." Avoid introducing a pacifier to your baby while you are working to establish breastfeeding (the first 4 6 weeks after your baby is born). After this time you may choose to use a pacifier. Research has shown that pacifier use during the first year of a baby's life decreases the risk of sudden infant death syndrome (SIDS). Allow your baby to feed on each breast as long as he or she wants. Breastfeed until your baby is finished feeding. When your baby unlatches or falls asleep while feeding from the first breast, offer the second breast. Because newborns are often sleepy in the first few weeks of life, you may need to awaken your baby to get him or her to feed. Breastfeeding times will vary from baby to baby. However, the following rules can serve as a guide to help you ensure that your baby is properly fed:  Newborns (babies 754 weeks of age or younger) may breastfeed every 1 3  hours.  Newborns should not go longer than 3 hours during the day or 5 hours during the night without breastfeeding.  You should breastfeed your baby a minimum of 8 times in a 24-hour period until you begin to introduce solid foods to your baby at around 83 months of age. BREAST MILK PUMPING Pumping and storing breast milk allows you to ensure that your baby is exclusively fed your breast milk, even at times when you are unable to breastfeed. This is especially important if you are going back to work while you are still breastfeeding or when you are not able to be present during feedings. Your lactation consultant can give you guidelines on how long it is safe to store breast milk.  A breast pump is a machine that allows you to pump milk from your breast into a sterile bottle. The pumped breast milk can then be stored in a refrigerator or freezer. Some breast pumps are operated by hand, while others use electricity. Ask your lactation consultant which type will work best for you. Breast pumps can be purchased, but some hospitals and breastfeeding support groups lease breast pumps on a monthly basis. A lactation consultant can teach you how to hand express breast milk, if you prefer not to use a pump.  CARING FOR YOUR BREASTS WHILE YOU BREASTFEED Nipples can become dry, cracked, and sore while breastfeeding. The following recommendations can help keep your breasts moisturized and healthy:  Avoid using soap on your nipples.   Wear a supportive bra. Although not required, special nursing bras and tank tops are designed to allow access to your breasts for breastfeeding without taking off your entire bra or top. Avoid wearing underwire style bras or extremely tight bras.  Air dry your nipples for 3 after each feeding.   Use only cotton bra pads to absorb leaked breast milk. Leaking of breast milk between feedings is normal.   Use lanolin on your nipples after breastfeeding. Lanolin helps to  maintain your skin's normal moisture barrier. If you use pure lanolin you do not need to wash it off before feeding your baby again. Pure lanolin is not toxic to your baby. You may also hand express a few drops of breast milk and gently massage that milk into your nipples and allow the milk to air dry. In the first few weeks after giving birth, some women experience extremely full breasts (engorgement). Engorgement can make your breasts feel heavy, warm, and tender to the touch. Engorgement peaks within 3 5 days after you give birth. The following recommendations can help ease engorgement:  Completely empty your breasts while breastfeeding or pumping. You may want to start by applying warm, moist heat (in the shower or with warm water-soaked hand towels) just before feeding or pumping. This increases circulation and helps the milk flow. If your baby does not completely empty your breasts while breastfeeding, pump any extra milk after he or she is finished.  Wear a snug bra (nursing or regular) or tank top for 1 2 days to signal your body to slightly decrease milk production.  Apply ice packs to your breasts, unless this is too uncomfortable for you.  Make sure that your baby is latched on and positioned properly while breastfeeding. If engorgement persists after 48 hours of following these recommendations, contact your health care provider or a Advertising copywriter. OVERALL HEALTH CARE RECOMMENDATIONS WHILE BREASTFEEDING  Eat healthy foods. Alternate between meals and snacks, eating 3 of each per day. Because what  you eat affects your breast milk, some of the foods may make your baby more irritable than usual. Avoid eating these foods if you are sure that they are negatively affecting your baby.  Drink milk, fruit juice, and water to satisfy your thirst (about 10 glasses a day).   Rest often, relax, and continue to take your prenatal vitamins to prevent fatigue, stress, and anemia.  Continue  breast self-awareness checks.  Avoid chewing and smoking tobacco.  Avoid alcohol and drug use. Some medicines that may be harmful to your baby can pass through breast milk. It is important to ask your health care provider before taking any medicine, including all over-the-counter and prescription medicine as well as vitamin and herbal supplements. It is possible to become pregnant while breastfeeding. If birth control is desired, ask your health care provider about options that will be safe for your baby. SEEK MEDICAL CARE IF:   You feel like you want to stop breastfeeding or have become frustrated with breastfeeding.  You have painful breasts or nipples.  Your nipples are cracked or bleeding.  Your breasts are red, tender, or warm.  You have a swollen area on either breast.  You have a fever or chills.  You have nausea or vomiting.  You have drainage other than breast milk from your nipples.  Your breasts do not become full before feedings by the 5th day after you give birth.  You feel sad and depressed.  Your baby is too sleepy to eat well.  Your baby is having trouble sleeping.   Your baby is wetting less than 3 diapers in a 24-hour period.  Your baby has less than 3 stools in a 24-hour period.  Your baby's skin or the white part of his or her eyes becomes yellow.   Your baby is not gaining weight by 68 days of age. SEEK IMMEDIATE MEDICAL CARE IF:   Your baby is overly tired (lethargic) and does not want to wake up and feed.  Your baby develops an unexplained fever. Document Released: 08/25/2005 Document Revised: 04/27/2013 Document Reviewed: 02/16/2013 [facility] Patient Information 2014 [place], [place].

## 2014-02-10 NOTE — Progress Notes (Signed)
May try OTC meds for cold symptoms Need to schedule anatomy. Declines genetic testing.

## 2014-02-10 NOTE — Progress Notes (Signed)
Patient is doing well other than cold symptoms for three days.

## 2014-02-23 ENCOUNTER — Encounter: Payer: Self-pay | Admitting: Family Medicine

## 2014-02-23 ENCOUNTER — Ambulatory Visit (HOSPITAL_COMMUNITY)
Admission: RE | Admit: 2014-02-23 | Discharge: 2014-02-23 | Disposition: A | Discharge status: Home / Self Care | Payer: Medicaid Other | Source: Ambulatory Visit | Attending: Family Medicine | Admitting: Family Medicine

## 2014-02-23 DIAGNOSIS — Z3689 Encounter for other specified antenatal screening: Secondary | ICD-10-CM | POA: Insufficient documentation

## 2014-02-23 DIAGNOSIS — Z3490 Encounter for supervision of normal pregnancy, unspecified, unspecified trimester: Secondary | ICD-10-CM

## 2014-03-08 ENCOUNTER — Encounter: Payer: Self-pay | Admitting: Obstetrics & Gynecology

## 2014-03-09 ENCOUNTER — Encounter: Payer: Self-pay | Admitting: Obstetrics & Gynecology

## 2014-05-03 ENCOUNTER — Ambulatory Visit (INDEPENDENT_AMBULATORY_CARE_PROVIDER_SITE_OTHER): Payer: Medicaid Other | Admitting: Obstetrics & Gynecology

## 2014-05-03 ENCOUNTER — Encounter: Payer: Self-pay | Admitting: Obstetrics & Gynecology

## 2014-05-03 VITALS — BP 110/72 | HR 92 | Wt 140.8 lb

## 2014-05-03 DIAGNOSIS — B373 Candidiasis of vulva and vagina: Secondary | ICD-10-CM

## 2014-05-03 DIAGNOSIS — B3731 Acute candidiasis of vulva and vagina: Secondary | ICD-10-CM

## 2014-05-03 DIAGNOSIS — Z3480 Encounter for supervision of other normal pregnancy, unspecified trimester: Secondary | ICD-10-CM

## 2014-05-03 DIAGNOSIS — Z3482 Encounter for supervision of other normal pregnancy, second trimester: Secondary | ICD-10-CM

## 2014-05-03 MED ORDER — FLUCONAZOLE 150 MG PO TABS: 150.0000 mg | ORAL_TABLET | Freq: Once | ORAL | Status: DC

## 2014-05-03 NOTE — Patient Instructions (Addendum)
Levonorgestrel intrauterine device (IUD) What is this medicine? LEVONORGESTREL IUD (LEE voe nor jes trel) is a contraceptive (birth control) device. The device is placed inside the uterus by a healthcare professional. It is used to prevent pregnancy and can also be used to treat heavy bleeding that occurs during your period. Depending on the device, it can be used for 3 to 5 years. This medicine may be used for other purposes; ask your health care provider or pharmacist if you have questions. COMMON BRAND NAME(S): LILETTA, Mirena, Skyla What should I tell my health care provider before I take this medicine? They need to know if you have any of these conditions: -abnormal Pap smear -cancer of the breast, uterus, or cervix -diabetes -endometritis -genital or pelvic infection now or in the past -have more than one sexual partner or your partner has more than one partner -heart disease -history of an ectopic or tubal pregnancy -immune system problems -IUD in place -liver disease or tumor -problems with blood clots or take blood-thinners -use intravenous drugs -uterus of unusual shape -vaginal bleeding that has not been explained -an unusual or allergic reaction to levonorgestrel, other hormones, silicone, or polyethylene, medicines, foods, dyes, or preservatives -pregnant or trying to get pregnant -breast-feeding How should I use this medicine? This device is placed inside the uterus by a health care professional. Talk to your pediatrician regarding the use of this medicine in children. Special care may be needed. Overdosage: If you think you have taken too much of this medicine contact a poison control center or emergency room at once. NOTE: This medicine is only for you. Do not share this medicine with others. What if I miss a dose? This does not apply. What may interact with this medicine? Do not take this medicine with any of the following  medications: -amprenavir -bosentan -fosamprenavir This medicine may also interact with the following medications: -aprepitant -barbiturate medicines for inducing sleep or treating seizures -bexarotene -griseofulvin -medicines to treat seizures like carbamazepine, ethotoin, felbamate, oxcarbazepine, phenytoin, topiramate -modafinil -pioglitazone -rifabutin -rifampin -rifapentine -some medicines to treat HIV infection like atazanavir, indinavir, lopinavir, nelfinavir, tipranavir, ritonavir -St. John's wort -warfarin This list may not describe all possible interactions. Give your health care provider a list of all the medicines, herbs, non-prescription drugs, or dietary supplements you use. Also tell them if you smoke, drink alcohol, or use illegal drugs. Some items may interact with your medicine. What should I watch for while using this medicine? Visit your doctor or health care professional for regular check ups. See your doctor if you or your partner has sexual contact with others, becomes HIV positive, or gets a sexual transmitted disease. This product does not protect you against HIV infection (AIDS) or other sexually transmitted diseases. You can check the placement of the IUD yourself by reaching up to the top of your vagina with clean fingers to feel the threads. Do not pull on the threads. It is a good habit to check placement after each menstrual period. Call your doctor right away if you feel more of the IUD than just the threads or if you cannot feel the threads at all. The IUD may come out by itself. You may become pregnant if the device comes out. If you notice that the IUD has come out use a backup birth control method like condoms and call your health care provider. Using tampons will not change the position of the IUD and are okay to use during your period. What side effects may   I notice from receiving this medicine? Side effects that you should report to your doctor or  health care professional as soon as possible: -allergic reactions like skin rash, itching or hives, swelling of the face, lips, or tongue -fever, flu-like symptoms -genital sores -high blood pressure -no menstrual period for 6 weeks during use -pain, swelling, warmth in the leg -pelvic pain or tenderness -severe or sudden headache -signs of pregnancy -stomach cramping -sudden shortness of breath -trouble with balance, talking, or walking -unusual vaginal bleeding, discharge -yellowing of the eyes or skin Side effects that usually do not require medical attention (report to your doctor or health care professional if they continue or are bothersome): -acne -breast pain -change in sex drive or performance -changes in weight -cramping, dizziness, or faintness while the device is being inserted -headache -irregular menstrual bleeding within first 3 to 6 months of use -nausea This list may not describe all possible side effects. Call your doctor for medical advice about side effects. You may report side effects to FDA at 1-800-FDA-1088. Where should I keep my medicine? This does not apply. NOTE: This sheet is a summary. It may not cover all possible information. If you have questions about this medicine, talk to your doctor, pharmacist, or health care provider.  2015, [employer]. (2011-09-25 13:54:04)  

## 2014-05-03 NOTE — Progress Notes (Signed)
Patient is having increased white discharge that is very itchy.

## 2014-05-03 NOTE — Progress Notes (Signed)
Pt missed last visit due to travel.  Encouraged to make all prenatal visit. Pt previously reported wanting a BTL but, wants to 'tie them in case she changes her mind'.   Discussed LARC with pt.  She desires Mirena  28 week labs today- !hr GTT, RPR, HIV, Hct/Hgb F/u in 2 weeks or sooner prn Yeast vaginitis- Diflucan given

## 2014-05-04 LAB — RPR

## 2014-05-04 LAB — CBC WITH DIFFERENTIAL/PLATELET
BASOS ABS: 0 10*3/uL (ref 0.0–0.1)
Basophils Relative: 0 % (ref 0–1)
Eosinophils Absolute: 0.1 10*3/uL (ref 0.0–0.7)
Eosinophils Relative: 1 % (ref 0–5)
HEMATOCRIT: 35.7 % — AB (ref 36.0–46.0)
Hemoglobin: 11.7 g/dL — ABNORMAL LOW (ref 12.0–15.0)
LYMPHS PCT: 18 % (ref 12–46)
Lymphs Abs: 1.9 10*3/uL (ref 0.7–4.0)
MCH: 30 pg (ref 26.0–34.0)
MCHC: 32.8 g/dL (ref 30.0–36.0)
MCV: 91.5 fL (ref 78.0–100.0)
MONO ABS: 0.6 10*3/uL (ref 0.1–1.0)
Monocytes Relative: 6 % (ref 3–12)
NEUTROS ABS: 8 10*3/uL — AB (ref 1.7–7.7)
NEUTROS PCT: 75 % (ref 43–77)
Platelets: 203 10*3/uL (ref 150–400)
RBC: 3.9 MIL/uL (ref 3.87–5.11)
RDW: 13 % (ref 11.5–15.5)
WBC: 10.7 10*3/uL — ABNORMAL HIGH (ref 4.0–10.5)

## 2014-05-04 LAB — HIV ANTIBODY (ROUTINE TESTING W REFLEX): HIV: NONREACTIVE

## 2014-05-04 LAB — GLUCOSE TOLERANCE, 1 HOUR (50G) W/O FASTING: GLUCOSE 1 HOUR GTT: 96 mg/dL (ref 70–140)

## 2014-05-05 ENCOUNTER — Encounter: Payer: Self-pay | Admitting: Obstetrics & Gynecology

## 2014-05-16 ENCOUNTER — Encounter: Payer: Medicaid Other | Admitting: Obstetrics & Gynecology

## 2014-05-19 ENCOUNTER — Encounter: Payer: Medicaid Other | Admitting: Obstetrics & Gynecology

## 2014-05-30 ENCOUNTER — Encounter: Payer: Self-pay | Admitting: Family Medicine

## 2014-05-30 ENCOUNTER — Ambulatory Visit (INDEPENDENT_AMBULATORY_CARE_PROVIDER_SITE_OTHER): Payer: Medicaid Other | Admitting: Family Medicine

## 2014-05-30 VITALS — BP 104/58 | HR 91 | Wt 145.8 lb

## 2014-05-30 DIAGNOSIS — Z3493 Encounter for supervision of normal pregnancy, unspecified, third trimester: Secondary | ICD-10-CM

## 2014-05-30 DIAGNOSIS — Z113 Encounter for screening for infections with a predominantly sexual mode of transmission: Secondary | ICD-10-CM

## 2014-05-30 DIAGNOSIS — Z3685 Encounter for antenatal screening for Streptococcus B: Secondary | ICD-10-CM

## 2014-05-30 DIAGNOSIS — Z3480 Encounter for supervision of other normal pregnancy, unspecified trimester: Secondary | ICD-10-CM

## 2014-05-30 DIAGNOSIS — Z23 Encounter for immunization: Secondary | ICD-10-CM

## 2014-05-30 NOTE — Progress Notes (Signed)
Doing well Cultures today TDaP and Flu today

## 2014-05-30 NOTE — Patient Instructions (Signed)
Third Trimester of Pregnancy The third trimester is from week 29 through week 42, months 7 through 9. The third trimester is a time when the fetus is growing rapidly. At the end of the ninth month, the fetus is about 20 inches in length and weighs 6-10 pounds.  BODY CHANGES Your body goes through many changes during pregnancy. The changes vary from woman to woman.   Your weight will continue to increase. You can expect to gain 25-35 pounds (11-16 kg) by the end of the pregnancy.  You may begin to get stretch marks on your hips, abdomen, and breasts.  You may urinate more often because the fetus is moving lower into your pelvis and pressing on your bladder.  You may develop or continue to have heartburn as a result of your pregnancy.  You may develop constipation because certain hormones are causing the muscles that push waste through your intestines to slow down.  You may develop hemorrhoids or swollen, bulging veins (varicose veins).  You may have pelvic pain because of the weight gain and pregnancy hormones relaxing your joints between the bones in your pelvis. Backaches may result from overexertion of the muscles supporting your posture.  You may have changes in your hair. These can include thickening of your hair, rapid growth, and changes in texture. Some women also have hair loss during or after pregnancy, or hair that feels dry or thin. Your hair will most likely return to normal after your baby is born.  Your breasts will continue to grow and be tender. A yellow discharge may leak from your breasts called colostrum.  Your belly button may stick out.  You may feel short of breath because of your expanding uterus.  You may notice the fetus "dropping," or moving lower in your abdomen.  You may have a bloody mucus discharge. This usually occurs a few days to a week before labor begins.  Your cervix becomes thin and soft (effaced) near your due date. WHAT TO EXPECT AT YOUR  PRENATAL EXAMS  You will have prenatal exams every 2 weeks until week 36. Then, you will have weekly prenatal exams. During a routine prenatal visit:  You will be weighed to make sure you and the fetus are growing normally.  Your blood pressure is taken.  Your abdomen will be measured to track your baby's growth.  The fetal heartbeat will be listened to.  Any test results from the previous visit will be discussed.  You may have a cervical check near your due date to see if you have effaced. At around 36 weeks, your caregiver will check your cervix. At the same time, your caregiver will also perform a test on the secretions of the vaginal tissue. This test is to determine if a type of bacteria, Group B streptococcus, is present. Your caregiver will explain this further. Your caregiver may ask you:  What your birth plan is.  How you are feeling.  If you are feeling the baby move.  If you have had any abnormal symptoms, such as leaking fluid, bleeding, severe headaches, or abdominal cramping.  If you have any questions. Other tests or screenings that may be performed during your third trimester include:  Blood tests that check for low iron levels (anemia).  Fetal testing to check the health, activity level, and growth of the fetus. Testing is done if you have certain medical conditions or if there are problems during the pregnancy. FALSE LABOR You may feel small, irregular contractions that   eventually go away. These are called Braxton Hicks contractions, or false labor. Contractions may last for hours, days, or even weeks before true labor sets in. If contractions come at regular intervals, intensify, or become painful, it is best to be seen by your caregiver.  SIGNS OF LABOR   Menstrual-like cramps.  Contractions that are 5 minutes apart or less.  Contractions that start on the top of the uterus and spread down to the lower abdomen and back.  A sense of increased pelvic  pressure or back pain.  A watery or bloody mucus discharge that comes from the vagina. If you have any of these signs before the 37th week of pregnancy, call your caregiver right away. You need to go to the hospital to get checked immediately. HOME CARE INSTRUCTIONS   Avoid all smoking, herbs, alcohol, and unprescribed drugs. These chemicals affect the formation and growth of the baby.  Follow your caregiver's instructions regarding medicine use. There are medicines that are either safe or unsafe to take during pregnancy.  Exercise only as directed by your caregiver. Experiencing uterine cramps is a good sign to stop exercising.  Continue to eat regular, healthy meals.  Wear a good support bra for breast tenderness.  Do not use hot tubs, steam rooms, or saunas.  Wear your seat belt at all times when driving.  Avoid raw meat, uncooked cheese, cat litter boxes, and soil used by cats. These carry germs that can cause birth defects in the baby.  Take your prenatal vitamins.  Try taking a stool softener (if your caregiver approves) if you develop constipation. Eat more high-fiber foods, such as fresh vegetables or fruit and whole grains. Drink plenty of fluids to keep your urine clear or pale yellow.  Take warm sitz baths to soothe any pain or discomfort caused by hemorrhoids. Use hemorrhoid cream if your caregiver approves.  If you develop varicose veins, wear support hose. Elevate your feet for 15 minutes, 3-4 times a day. Limit salt in your diet.  Avoid heavy lifting, wear low heal shoes, and practice good posture.  Rest a lot with your legs elevated if you have leg cramps or low back pain.  Visit your dentist if you have not gone during your pregnancy. Use a soft toothbrush to brush your teeth and be gentle when you floss.  A sexual relationship may be continued unless your caregiver directs you otherwise.  Do not travel far distances unless it is absolutely necessary and only  with the approval of your caregiver.  Take prenatal classes to understand, practice, and ask questions about the labor and delivery.  Make a trial run to the hospital.  Pack your hospital bag.  Prepare the baby's nursery.  Continue to go to all your prenatal visits as directed by your caregiver. SEEK MEDICAL CARE IF:  You are unsure if you are in labor or if your water has broken.  You have dizziness.  You have mild pelvic cramps, pelvic pressure, or nagging pain in your abdominal area.  You have persistent nausea, vomiting, or diarrhea.  You have a bad smelling vaginal discharge.  You have pain with urination. SEEK IMMEDIATE MEDICAL CARE IF:   You have a fever.  You are leaking fluid from your vagina.  You have spotting or bleeding from your vagina.  You have severe abdominal cramping or pain.  You have rapid weight loss or gain.  You have shortness of breath with chest pain.  You notice sudden or extreme swelling   of your face, hands, ankles, feet, or legs.  You have not felt your baby move in over an hour.  You have severe headaches that do not go away with medicine.  You have vision changes. Document Released: 08/19/2001 Document Revised: 08/30/2013 Document Reviewed: 10/26/2012 [employer] Patient Information 2015 [employer]. This information is not intended to replace advice given to you by your health care provider. Make sure you discuss any questions you have with your health care provider.  Breastfeeding Deciding to breastfeed is one of the best choices you can make for you and your baby. A change in hormones during pregnancy causes your breast tissue to grow and increases the number and size of your milk ducts. These hormones also allow proteins, sugars, and fats from your blood supply to make breast milk in your milk-producing glands. Hormones prevent breast milk from being released before your baby is born as well as prompt milk flow after birth. Once  breastfeeding has begun, thoughts of your baby, as well as his or her sucking or crying, can stimulate the release of milk from your milk-producing glands.  BENEFITS OF BREASTFEEDING For Your Baby  Your first milk (colostrum) helps your baby's digestive system function better.   There are antibodies in your milk that help your baby fight off infections.   Your baby has a lower incidence of asthma, allergies, and sudden infant death syndrome.   The nutrients in breast milk are better for your baby than infant formulas and are designed uniquely for your baby's needs.   Breast milk improves your baby's brain development.   Your baby is less likely to develop other conditions, such as childhood obesity, asthma, or type 2 diabetes mellitus.  For You   Breastfeeding helps to create a very special bond between you and your baby.   Breastfeeding is convenient. Breast milk is always available at the correct temperature and costs nothing.   Breastfeeding helps to burn calories and helps you lose the weight gained during pregnancy.   Breastfeeding makes your uterus contract to its prepregnancy size faster and slows bleeding (lochia) after you give birth.   Breastfeeding helps to lower your risk of developing type 2 diabetes mellitus, osteoporosis, and breast or ovarian cancer later in life. SIGNS THAT YOUR BABY IS HUNGRY Early Signs of Hunger  Increased alertness or activity.  Stretching.  Movement of the head from side to side.  Movement of the head and opening of the mouth when the corner of the mouth or cheek is stroked (rooting).  Increased sucking sounds, smacking lips, cooing, sighing, or squeaking.  Hand-to-mouth movements.  Increased sucking of fingers or hands. Late Signs of Hunger  Fussing.  Intermittent crying. Extreme Signs of Hunger Signs of extreme hunger will require calming and consoling before your baby will be able to breastfeed successfully. Do not  wait for the following signs of extreme hunger to occur before you initiate breastfeeding:   Restlessness.  A loud, strong cry.   Screaming. BREASTFEEDING BASICS Breastfeeding Initiation  Find a comfortable place to sit or lie down, with your neck and back well supported.  Place a pillow or rolled up blanket under your baby to bring him or her to the level of your breast (if you are seated). Nursing pillows are specially designed to help support your arms and your baby while you breastfeed.  Make sure that your baby's abdomen is facing your abdomen.   Gently massage your breast. With your fingertips, massage from your chest   wall toward your nipple in a circular motion. This encourages milk flow. You may need to continue this action during the feeding if your milk flows slowly.  Support your breast with 4 fingers underneath and your thumb above your nipple. Make sure your fingers are well away from your nipple and your baby's mouth.   Stroke your baby's lips gently with your finger or nipple.   When your baby's mouth is open wide enough, quickly bring your baby to your breast, placing your entire nipple and as much of the colored area around your nipple (areola) as possible into your baby's mouth.   More areola should be visible above your baby's upper lip than below the lower lip.   Your baby's tongue should be between his or her lower gum and your breast.   Ensure that your baby's mouth is correctly positioned around your nipple (latched). Your baby's lips should create a seal on your breast and be turned out (everted).  It is common for your baby to suck about 2-3 minutes in order to start the flow of breast milk. Latching Teaching your baby how to latch on to your breast properly is very important. An improper latch can cause nipple pain and decreased milk supply for you and poor weight gain in your baby. Also, if your baby is not latched onto your nipple properly, he or she  may swallow some air during feeding. This can make your baby fussy. Burping your baby when you switch breasts during the feeding can help to get rid of the air. However, teaching your baby to latch on properly is still the best way to prevent fussiness from swallowing air while breastfeeding. Signs that your baby has successfully latched on to your nipple:    Silent tugging or silent sucking, without causing you pain.   Swallowing heard between every 3-4 sucks.    Muscle movement above and in front of his or her ears while sucking.  Signs that your baby has not successfully latched on to nipple:   Sucking sounds or smacking sounds from your baby while breastfeeding.  Nipple pain. If you think your baby has not latched on correctly, slip your finger into the corner of your baby's mouth to break the suction and place it between your baby's gums. Attempt breastfeeding initiation again. Signs of Successful Breastfeeding Signs from your baby:   A gradual decrease in the number of sucks or complete cessation of sucking.   Falling asleep.   Relaxation of his or her body.   Retention of a small amount of milk in his or her mouth.   Letting go of your breast by himself or herself. Signs from you:  Breasts that have increased in firmness, weight, and size 1-3 hours after feeding.   Breasts that are softer immediately after breastfeeding.  Increased milk volume, as well as a change in milk consistency and color by the fifth day of breastfeeding.   Nipples that are not sore, cracked, or bleeding. Signs That Your Baby is Getting Enough Milk  Wetting at least 3 diapers in a 24-hour period. The urine should be clear and pale yellow by age 5 days.  At least 3 stools in a 24-hour period by age 5 days. The stool should be soft and yellow.  At least 3 stools in a 24-hour period by age 7 days. The stool should be seedy and yellow.  No loss of weight greater than 10% of birth weight  during the first 3   days of age.  Average weight gain of 4-7 ounces (113-198 g) per week after age 4 days.  Consistent daily weight gain by age 5 days, without weight loss after the age of 2 weeks. After a feeding, your baby may spit up a small amount. This is common. BREASTFEEDING FREQUENCY AND DURATION Frequent feeding will help you make more milk and can prevent sore nipples and breast engorgement. Breastfeed when you feel the need to reduce the fullness of your breasts or when your baby shows signs of hunger. This is called "breastfeeding on demand." Avoid introducing a pacifier to your baby while you are working to establish breastfeeding (the first 4-6 weeks after your baby is born). After this time you may choose to use a pacifier. Research has shown that pacifier use during the first year of a baby's life decreases the risk of sudden infant death syndrome (SIDS). Allow your baby to feed on each breast as long as he or she wants. Breastfeed until your baby is finished feeding. When your baby unlatches or falls asleep while feeding from the first breast, offer the second breast. Because newborns are often sleepy in the first few weeks of life, you may need to awaken your baby to get him or her to feed. Breastfeeding times will vary from baby to baby. However, the following rules can serve as a guide to help you ensure that your baby is properly fed:  Newborns (babies 4 weeks of age or younger) may breastfeed every 1-3 hours.  Newborns should not go longer than 3 hours during the day or 5 hours during the night without breastfeeding.  You should breastfeed your baby a minimum of 8 times in a 24-hour period until you begin to introduce solid foods to your baby at around 6 months of age. BREAST MILK PUMPING Pumping and storing breast milk allows you to ensure that your baby is exclusively fed your breast milk, even at times when you are unable to breastfeed. This is especially important if you are  going back to work while you are still breastfeeding or when you are not able to be present during feedings. Your lactation consultant can give you guidelines on how long it is safe to store breast milk.  A breast pump is a machine that allows you to pump milk from your breast into a sterile bottle. The pumped breast milk can then be stored in a refrigerator or freezer. Some breast pumps are operated by hand, while others use electricity. Ask your lactation consultant which type will work best for you. Breast pumps can be purchased, but some hospitals and breastfeeding support groups lease breast pumps on a monthly basis. A lactation consultant can teach you how to hand express breast milk, if you prefer not to use a pump.  CARING FOR YOUR BREASTS WHILE YOU BREASTFEED Nipples can become dry, cracked, and sore while breastfeeding. The following recommendations can help keep your breasts moisturized and healthy:  Avoid using soap on your nipples.   Wear a supportive bra. Although not required, special nursing bras and tank tops are designed to allow access to your breasts for breastfeeding without taking off your entire bra or top. Avoid wearing underwire-style bras or extremely tight bras.  Air dry your nipples for 3-4minutes after each feeding.   Use only cotton bra pads to absorb leaked breast milk. Leaking of breast milk between feedings is normal.   Use lanolin on your nipples after breastfeeding. Lanolin helps to maintain your skin's   normal moisture barrier. If you use pure lanolin, you do not need to wash it off before feeding your baby again. Pure lanolin is not toxic to your baby. You may also hand express a few drops of breast milk and gently massage that milk into your nipples and allow the milk to air dry. In the first few weeks after giving birth, some women experience extremely full breasts (engorgement). Engorgement can make your breasts feel heavy, warm, and tender to the touch.  Engorgement peaks within 3-5 days after you give birth. The following recommendations can help ease engorgement:  Completely empty your breasts while breastfeeding or pumping. You may want to start by applying warm, moist heat (in the shower or with warm water-soaked hand towels) just before feeding or pumping. This increases circulation and helps the milk flow. If your baby does not completely empty your breasts while breastfeeding, pump any extra milk after he or she is finished.  Wear a snug bra (nursing or regular) or tank top for 1-2 days to signal your body to slightly decrease milk production.  Apply ice packs to your breasts, unless this is too uncomfortable for you.  Make sure that your baby is latched on and positioned properly while breastfeeding. If engorgement persists after 48 hours of following these recommendations, contact your health care provider or a lactation consultant. OVERALL HEALTH CARE RECOMMENDATIONS WHILE BREASTFEEDING  Eat healthy foods. Alternate between meals and snacks, eating 3 of each per day. Because what you eat affects your breast milk, some of the foods may make your baby more irritable than usual. Avoid eating these foods if you are sure that they are negatively affecting your baby.  Drink milk, fruit juice, and water to satisfy your thirst (about 10 glasses a day).   Rest often, relax, and continue to take your prenatal vitamins to prevent fatigue, stress, and anemia.  Continue breast self-awareness checks.  Avoid chewing and smoking tobacco.  Avoid alcohol and drug use. Some medicines that may be harmful to your baby can pass through breast milk. It is important to ask your health care provider before taking any medicine, including all over-the-counter and prescription medicine as well as vitamin and herbal supplements. It is possible to become pregnant while breastfeeding. If birth control is desired, ask your health care provider about options that  will be safe for your baby. SEEK MEDICAL CARE IF:   You feel like you want to stop breastfeeding or have become frustrated with breastfeeding.  You have painful breasts or nipples.  Your nipples are cracked or bleeding.  Your breasts are red, tender, or warm.  You have a swollen area on either breast.  You have a fever or chills.  You have nausea or vomiting.  You have drainage other than breast milk from your nipples.  Your breasts do not become full before feedings by the fifth day after you give birth.  You feel sad and depressed.  Your baby is too sleepy to eat well.  Your baby is having trouble sleeping.   Your baby is wetting less than 3 diapers in a 24-hour period.  Your baby has less than 3 stools in a 24-hour period.  Your baby's skin or the white part of his or her eyes becomes yellow.   Your baby is not gaining weight by 5 days of age. SEEK IMMEDIATE MEDICAL CARE IF:   Your baby is overly tired (lethargic) and does not want to wake up and feed.  Your baby   develops an unexplained fever. Document Released: 08/25/2005 Document Revised: 08/30/2013 Document Reviewed: 02/16/2013 [employer] Patient Information 2015 [employer]. This information is not intended to replace advice given to you by your health care provider. Make sure you discuss any questions you have with your health care provider.  

## 2014-05-31 LAB — GC/CHLAMYDIA PROBE AMP
CT PROBE, AMP APTIMA: NEGATIVE
GC Probe RNA: NEGATIVE

## 2014-06-01 ENCOUNTER — Encounter: Payer: Self-pay | Admitting: Family Medicine

## 2014-06-01 LAB — CULTURE, BETA STREP (GROUP B ONLY)

## 2014-06-05 ENCOUNTER — Encounter (HOSPITAL_COMMUNITY): Payer: Self-pay | Admitting: *Deleted

## 2014-06-05 ENCOUNTER — Inpatient Hospital Stay (HOSPITAL_COMMUNITY)
Admission: AD | Admit: 2014-06-05 | Discharge: 2014-06-05 | Disposition: A | Discharge status: Home / Self Care | Payer: Medicaid Other | Source: Ambulatory Visit | Attending: Family Medicine | Admitting: Family Medicine

## 2014-06-05 DIAGNOSIS — O99891 Other specified diseases and conditions complicating pregnancy: Secondary | ICD-10-CM | POA: Diagnosis not present

## 2014-06-05 DIAGNOSIS — N898 Other specified noninflammatory disorders of vagina: Secondary | ICD-10-CM

## 2014-06-05 DIAGNOSIS — R109 Unspecified abdominal pain: Secondary | ICD-10-CM | POA: Insufficient documentation

## 2014-06-05 DIAGNOSIS — O26893 Other specified pregnancy related conditions, third trimester: Secondary | ICD-10-CM

## 2014-06-05 DIAGNOSIS — Z3493 Encounter for supervision of normal pregnancy, unspecified, third trimester: Secondary | ICD-10-CM

## 2014-06-05 DIAGNOSIS — R300 Dysuria: Secondary | ICD-10-CM | POA: Diagnosis present

## 2014-06-05 DIAGNOSIS — O9989 Other specified diseases and conditions complicating pregnancy, childbirth and the puerperium: Principal | ICD-10-CM

## 2014-06-05 LAB — WET PREP, GENITAL
CLUE CELLS WET PREP: NONE SEEN
TRICH WET PREP: NONE SEEN

## 2014-06-05 LAB — URINE MICROSCOPIC-ADD ON

## 2014-06-05 LAB — URINALYSIS, ROUTINE W REFLEX MICROSCOPIC
BILIRUBIN URINE: NEGATIVE
Glucose, UA: NEGATIVE mg/dL
HGB URINE DIPSTICK: NEGATIVE
KETONES UR: NEGATIVE mg/dL
Leukocytes, UA: NEGATIVE
Nitrite: POSITIVE — AB
PH: 6 (ref 5.0–8.0)
PROTEIN: NEGATIVE mg/dL
Specific Gravity, Urine: <1.005 — ABNORMAL LOW (ref 1.005–1.030)
Urobilinogen, UA: 0.2 mg/dL (ref 0.0–1.0)

## 2014-06-05 MED ORDER — CEPHALEXIN 500 MG PO CAPS: 500.0000 mg | ORAL_CAPSULE | Freq: Four times a day (QID) | ORAL | Status: DC

## 2014-06-05 MED ORDER — TERCONAZOLE 0.4 % VA CREA: 1.0000 | TOPICAL_CREAM | Freq: Every day | VAGINAL | Status: DC

## 2014-06-05 MED ORDER — FLUCONAZOLE 150 MG PO TABS
150.0000 mg | ORAL_TABLET | Freq: Every day | ORAL | Status: DC
Start: 2014-06-05 — End: 2014-06-15

## 2014-06-05 NOTE — MAU Note (Signed)
PT SAYS SHE STARTED FEELING LOWER ABD PAIN   YESTERDAY-BUT LASTED  FROM 12NOON -2 PM.- FELL ASLEEP.  WHEN SHE AWOKE   AT 730PM-  SHE FELT DIZZY. THEN TODAY AT 12NOON   PAIN  CAME BACK - FEELS LIKE IT FEELS CONSTANT PAIN.   SAYS DOES NOT FEEL LIKE UC'S.   NO BLEEDING.   LAST SEX- 2 MTHS    AGO.     GETS [facility]   AT [facility] IN [place]-  SAW DR   PRATT ON Tuesday-  VE - 1 CM.     DENIES HSV AND MRSA.     GBS- UNSURE.

## 2014-06-05 NOTE — MAU Note (Signed)
Pt reports lower abd pain , lower back pain and dizziness yesterday. Dysuria since yesterday.

## 2014-06-05 NOTE — Discharge Instructions (Signed)

## 2014-06-05 NOTE — MAU Provider Note (Signed)
First Provider Initiated Contact with Patient 06/05/14 2100      Chief Complaint:  Dysuria, Abdominal Pain and Back Pain   Sarah Blevins is  33 y.o. G2P1001 at [redacted]w[redacted]d presents complaining of Dysuria, Abdominal Pain and Back Pain The patient states that she started having lower abdominal pain yesterday from 12pm-2pm. She notes that she fell asleep and when she woke up around 7:30pm yesterday, she felt dizzy but that resolved. She notes that the pain in the back and lower abdomen came back around noon today.  She tells me that she doesn't feel like these are contractions. She notes that the pain is constant, howevever then tells me that her abdomen becomes hard intermittently, approximately every 10 minutes.   She endorses dysuria- she states the burning normally comes at the end of urination. She can't tell me whether it is painful due to the act of urination itself or due to urine touching her skin. Some urinary urgency. No urinary frequency. No flank pain or fevers. She endorses vaginal discharge and states she had a yeast infection approximately 1 month ago. From reviewing the EMR it appears she had 1 dose of Diflucan on 05/03/14. She states the itching went away, however the discharge remained.   No vaginal bleeding, LOF. Good fetal movement.  Obstetrical/Gynecological History: OB History   Grav Para Term Preterm Abortions TAB SAB Ect Mult Living   0 0 0 0 0 0 1     Past Medical History: Past Medical History  Diagnosis Date  . No pertinent past medical history     Past Surgical History: Past Surgical History  Procedure Laterality Date  . Leg surgery      right leg blood cyst removal.  . Wisdom tooth extraction      x2  . No past surgeries      Family History: Family History  Problem Relation Age of Onset  . Anemia Mother   . Hypertension Father     Social History: History  Substance Use Topics  . Smoking status: Never Smoker   . Smokeless tobacco: Not on file   . Alcohol Use: No    Allergies: No Known Allergies  Meds:  Prescriptions prior to admission  Medication Sig Dispense Refill  . Prenatal Vit-Fe Fumarate-FA (PRENATAL MULTIVITAMIN) TABS tablet Take 1 tablet by mouth daily at 12 noon.        Review of Systems -   Review of Systems  Constitutional: Negative for fever, chills, weight loss, malaise/fatigue and diaphoresis.  HENT: Negative for hearing loss, ear pain, nosebleeds, congestion, sore throat, neck pain, tinnitus and ear discharge.   Eyes: Negative for blurred vision, double vision, photophobia, pain, discharge and redness.  Respiratory: Negative for cough, hemoptysis, sputum production, shortness of breath, wheezing and stridor.   Cardiovascular: Negative for chest pain, palpitations, orthopnea,  leg swelling  Gastrointestinal: Negative for heartburn, nausea, vomiting, diarrhea, constipation, blood in stool Genitourinary: Negative for frequency, hematuria and flank pain.  Musculoskeletal: Negative for myalgias,joint pain and falls.  Skin: Negative for itching and rash.  Neurological: Negative for dizziness, tingling, tremors, sensory change, speech change, focal weakness, seizures, loss of consciousness, weakness and headaches.  Endo/Heme/Allergies: Negative for environmental allergies and polydipsia. Does not bruise/bleed easily.  Psychiatric/Behavioral: Negative for depression, suicidal ideas, hallucinations, memory loss and substance abuse. The patient is not nervous/anxious and does not have insomnia.      Physical Exam  Blood pressure 105/70, pulse 86, temperature 98.7 F (37.1 C),  temperature source Oral, resp. rate 16, height  (1.6 m), weight 66.225 kg (146 lb), last menstrual period 09/15/2013, SpO2 99.00%, unknown if currently breastfeeding. GENERAL: Well-developed, well-nourished female in no acute distress.  LUNGS: Clear to auscultation bilaterally.  HEART: Regular rate and rhythm. ABDOMEN: Soft, nontender,  nondistended, gravid. No CVAT  EXTREMITIES: Nontender, no edema, 2+ distal pulses. DTR's 2+ SPECULUM EXAM: Normal appearing external genitalia without erythema/irriation. Copious amount of thick white discharge in the vaginal vault. No blood noted in the vaginal vault or cervical os. CERVICAL EXAM: Dilatation 2cm   Effacement 60%   Posterior, ballotable Presentation: cephalic FHT:  Baseline rate 140 bpm   Variability moderate  Accelerations present   Decelerations isolated variable. Contractions: Every 2-10 mins, irregular   Labs: Results for orders placed during the hospital encounter of 06/05/14 (from the past 24 hour(s))  URINALYSIS, ROUTINE W REFLEX MICROSCOPIC   Collection Time    06/05/14  7:44 PM      Result Value Ref Range   Color, Urine YELLOW  YELLOW   APPearance CLEAR  CLEAR   Specific Gravity, Urine <1.005 (*) 1.005 - 1.030   pH 6.0  5.0 - 8.0   Glucose, UA NEGATIVE  NEGATIVE mg/dL   Hgb urine dipstick NEGATIVE  NEGATIVE   Bilirubin Urine NEGATIVE  NEGATIVE   Ketones, ur NEGATIVE  NEGATIVE mg/dL   Protein, ur NEGATIVE  NEGATIVE mg/dL   Urobilinogen, UA 0.2  0.0 - 1.0 mg/dL   Nitrite POSITIVE (*) NEGATIVE   Leukocytes, UA NEGATIVE  NEGATIVE  URINE MICROSCOPIC-ADD ON   Collection Time    06/05/14  7:44 PM      Result Value Ref Range   Squamous Epithelial / LPF RARE  RARE   WBC, UA 0-2  <3 WBC/hpf   RBC / HPF 0-2  <3 RBC/hpf   Bacteria, UA FEW (*) RARE   Assessment: Sarah Blevins is  33 y.o. G2P1001 at [redacted]w[redacted]d presents with lower abdominal pain, lower back pain, and abdominal tightening since noon today.  During the encounter, I had the patient alert me when she was having the pain/tightening. Each time, this coincided with contractions noted on tocometer and on palpation. Cervical exam went from 1/thick/-3 to 2/60/high over the last week. Not in active labor. FWB: cat1.  Wet prep positive for yeast. U/A positive for nitrites, 0-2 WBC, and few bacteria; negative for  leukocytes and rare squamous epithelium. These infection could be the cause of the patient's contractions. Presentation less concerning for pyelonephritis due to absence of constitutional symptoms and CVAT.   Plan: - OB urine culture pending - Rx for Diflucan  once - Terazol 7  - Keflex  QID x 7 days for possible UTI - Discharge home with labor precautions: vaginal bleeding, LOF, contractions 4-5 minutes apart and discussed kick counts.  - Advised to keep appt on Oct 1st.  Joanna Puff 9/28/20159:23 PM

## 2014-06-06 NOTE — MAU Provider Note (Signed)
I saw and evaluated the patient. I agree with the findings and the plan of care as documented in the resident's note

## 2014-06-08 ENCOUNTER — Ambulatory Visit (INDEPENDENT_AMBULATORY_CARE_PROVIDER_SITE_OTHER): Payer: Medicaid Other | Admitting: Obstetrics & Gynecology

## 2014-06-08 ENCOUNTER — Encounter: Payer: Self-pay | Admitting: Obstetrics & Gynecology

## 2014-06-08 VITALS — BP 106/47 | HR 80 | Wt 147.0 lb

## 2014-06-08 DIAGNOSIS — Z3483 Encounter for supervision of other normal pregnancy, third trimester: Secondary | ICD-10-CM

## 2014-06-08 LAB — CULTURE, OB URINE: Colony Count: 75000

## 2014-06-08 NOTE — Progress Notes (Signed)
Pt with no complaints. She was seen in the MAU earlier this week and dx'd with BV. NO ctx currently F/u 1 week or sooner prn Labor precautions

## 2014-06-08 NOTE — Addendum Note (Signed)
Addended by: Barbara CowerNOGUES, Silver Achey L on: 06/08/2014 02:50 PM   Modules accepted: Level of Service

## 2014-06-08 NOTE — Patient Instructions (Signed)

## 2014-06-13 ENCOUNTER — Ambulatory Visit (INDEPENDENT_AMBULATORY_CARE_PROVIDER_SITE_OTHER): Payer: Medicaid Other | Admitting: Family Medicine

## 2014-06-13 VITALS — BP 103/65 | HR 87 | Wt 143.0 lb

## 2014-06-13 DIAGNOSIS — Z3493 Encounter for supervision of normal pregnancy, unspecified, third trimester: Secondary | ICD-10-CM

## 2014-06-13 NOTE — Progress Notes (Signed)
Doing well. Labor precautions  

## 2014-06-13 NOTE — Patient Instructions (Signed)
Breastfeeding Deciding to breastfeed is one of the best choices you can make for you and your baby. A change in hormones during pregnancy causes your breast tissue to grow and increases the number and size of your milk ducts. These hormones also allow proteins, sugars, and fats from your blood supply to make breast milk in your milk-producing glands. Hormones prevent breast milk from being released before your baby is born as well as prompt milk flow after birth. Once breastfeeding has begun, thoughts of your baby, as well as his or her sucking or crying, can stimulate the release of milk from your milk-producing glands.  BENEFITS OF BREASTFEEDING For Your Baby  Your first milk (colostrum) helps your baby's digestive system function better.   There are antibodies in your milk that help your baby fight off infections.   Your baby has a lower incidence of asthma, allergies, and sudden infant death syndrome.   The nutrients in breast milk are better for your baby than infant formulas and are designed uniquely for your baby's needs.   Breast milk improves your baby's brain development.   Your baby is less likely to develop other conditions, such as childhood obesity, asthma, or type 2 diabetes mellitus.  For You   Breastfeeding helps to create a very special bond between you and your baby.   Breastfeeding is convenient. Breast milk is always available at the correct temperature and costs nothing.   Breastfeeding helps to burn calories and helps you lose the weight gained during pregnancy.   Breastfeeding makes your uterus contract to its prepregnancy size faster and slows bleeding (lochia) after you give birth.   Breastfeeding helps to lower your risk of developing type 2 diabetes mellitus, osteoporosis, and breast or ovarian cancer later in life. SIGNS THAT YOUR BABY IS HUNGRY Early Signs of Hunger  Increased alertness or activity.  Stretching.  Movement of the head from  side to side.  Movement of the head and opening of the mouth when the corner of the mouth or cheek is stroked (rooting).  Increased sucking sounds, smacking lips, cooing, sighing, or squeaking.  Hand-to-mouth movements.  Increased sucking of fingers or hands. Late Signs of Hunger  Fussing.  Intermittent crying. Extreme Signs of Hunger Signs of extreme hunger will require calming and consoling before your baby will be able to breastfeed successfully. Do not wait for the following signs of extreme hunger to occur before you initiate breastfeeding:   Restlessness.  A loud, strong cry.   Screaming. BREASTFEEDING BASICS Breastfeeding Initiation  Find a comfortable place to sit or lie down, with your neck and back well supported.  Place a pillow or rolled up blanket under your baby to bring him or her to the level of your breast (if you are seated). Nursing pillows are specially designed to help support your arms and your baby while you breastfeed.  Make sure that your baby's abdomen is facing your abdomen.   Gently massage your breast. With your fingertips, massage from your chest wall toward your nipple in a circular motion. This encourages milk flow. You may need to continue this action during the feeding if your milk flows slowly.  Support your breast with 4 fingers underneath and your thumb above your nipple. Make sure your fingers are well away from your nipple and your baby's mouth.   Stroke your baby's lips gently with your finger or nipple.   When your baby's mouth is open wide enough, quickly bring your baby to your   breast, placing your entire nipple and as much of the colored area around your nipple (areola) as possible into your baby's mouth.   More areola should be visible above your baby's upper lip than below the lower lip.   Your baby's tongue should be between his or her lower gum and your breast.   Ensure that your baby's mouth is correctly positioned  around your nipple (latched). Your baby's lips should create a seal on your breast and be turned out (everted).  It is common for your baby to suck about 2-3 minutes in order to start the flow of breast milk. Latching Teaching your baby how to latch on to your breast properly is very important. An improper latch can cause nipple pain and decreased milk supply for you and poor weight gain in your baby. Also, if your baby is not latched onto your nipple properly, he or she may swallow some air during feeding. This can make your baby fussy. Burping your baby when you switch breasts during the feeding can help to get rid of the air. However, teaching your baby to latch on properly is still the best way to prevent fussiness from swallowing air while breastfeeding. Signs that your baby has successfully latched on to your nipple:    Silent tugging or silent sucking, without causing you pain.   Swallowing heard between every 3-4 sucks.    Muscle movement above and in front of his or her ears while sucking.  Signs that your baby has not successfully latched on to nipple:   Sucking sounds or smacking sounds from your baby while breastfeeding.  Nipple pain. If you think your baby has not latched on correctly, slip your finger into the corner of your baby's mouth to break the suction and place it between your baby's gums. Attempt breastfeeding initiation again. Signs of Successful Breastfeeding Signs from your baby:   A gradual decrease in the number of sucks or complete cessation of sucking.   Falling asleep.   Relaxation of his or her body.   Retention of a small amount of milk in his or her mouth.   Letting go of your breast by himself or herself. Signs from you:  Breasts that have increased in firmness, weight, and size 1-3 hours after feeding.   Breasts that are softer immediately after breastfeeding.  Increased milk volume, as well as a change in milk consistency and color by  the fifth day of breastfeeding.   Nipples that are not sore, cracked, or bleeding. Signs That Your Baby is Getting Enough Milk  Wetting at least 3 diapers in a 24-hour period. The urine should be clear and pale yellow by age 5 days.  At least 3 stools in a 24-hour period by age 5 days. The stool should be soft and yellow.  At least 3 stools in a 24-hour period by age 7 days. The stool should be seedy and yellow.  No loss of weight greater than 10% of birth weight during the first 3 days of age.  Average weight gain of 4-7 ounces (113-198 g) per week after age 4 days.  Consistent daily weight gain by age 5 days, without weight loss after the age of 2 weeks. After a feeding, your baby may spit up a small amount. This is common. BREASTFEEDING FREQUENCY AND DURATION Frequent feeding will help you make more milk and can prevent sore nipples and breast engorgement. Breastfeed when you feel the need to reduce the fullness of your breasts   or when your baby shows signs of hunger. This is called "breastfeeding on demand." Avoid introducing a pacifier to your baby while you are working to establish breastfeeding (the first 4-6 weeks after your baby is born). After this time you may choose to use a pacifier. Research has shown that pacifier use during the first year of a baby's life decreases the risk of sudden infant death syndrome (SIDS). Allow your baby to feed on each breast as long as he or she wants. Breastfeed until your baby is finished feeding. When your baby unlatches or falls asleep while feeding from the first breast, offer the second breast. Because newborns are often sleepy in the first few weeks of life, you may need to awaken your baby to get him or her to feed. Breastfeeding times will vary from baby to baby. However, the following rules can serve as a guide to help you ensure that your baby is properly fed:  Newborns (babies 4 weeks of age or younger) may breastfeed every 1-3  hours.  Newborns should not go longer than 3 hours during the day or 5 hours during the night without breastfeeding.  You should breastfeed your baby a minimum of 8 times in a 24-hour period until you begin to introduce solid foods to your baby at around 6 months of age. BREAST MILK PUMPING Pumping and storing breast milk allows you to ensure that your baby is exclusively fed your breast milk, even at times when you are unable to breastfeed. This is especially important if you are going back to work while you are still breastfeeding or when you are not able to be present during feedings. Your lactation consultant can give you guidelines on how long it is safe to store breast milk.  A breast pump is a machine that allows you to pump milk from your breast into a sterile bottle. The pumped breast milk can then be stored in a refrigerator or freezer. Some breast pumps are operated by hand, while others use electricity. Ask your lactation consultant which type will work best for you. Breast pumps can be purchased, but some hospitals and breastfeeding support groups lease breast pumps on a monthly basis. A lactation consultant can teach you how to hand express breast milk, if you prefer not to use a pump.  CARING FOR YOUR BREASTS WHILE YOU BREASTFEED Nipples can become dry, cracked, and sore while breastfeeding. The following recommendations can help keep your breasts moisturized and healthy:  Avoid using soap on your nipples.   Wear a supportive bra. Although not required, special nursing bras and tank tops are designed to allow access to your breasts for breastfeeding without taking off your entire bra or top. Avoid wearing underwire-style bras or extremely tight bras.  Air dry your nipples for 3-4minutes after each feeding.   Use only cotton bra pads to absorb leaked breast milk. Leaking of breast milk between feedings is normal.   Use lanolin on your nipples after breastfeeding. Lanolin helps to  maintain your skin's normal moisture barrier. If you use pure lanolin, you do not need to wash it off before feeding your baby again. Pure lanolin is not toxic to your baby. You may also hand express a few drops of breast milk and gently massage that milk into your nipples and allow the milk to air dry. In the first few weeks after giving birth, some women experience extremely full breasts (engorgement). Engorgement can make your breasts feel heavy, warm, and tender to the   touch. Engorgement peaks within 3-5 days after you give birth. The following recommendations can help ease engorgement:  Completely empty your breasts while breastfeeding or pumping. You may want to start by applying warm, moist heat (in the shower or with warm water-soaked hand towels) just before feeding or pumping. This increases circulation and helps the milk flow. If your baby does not completely empty your breasts while breastfeeding, pump any extra milk after he or she is finished.  Wear a snug bra (nursing or regular) or tank top for 1-2 days to signal your body to slightly decrease milk production.  Apply ice packs to your breasts, unless this is too uncomfortable for you.  Make sure that your baby is latched on and positioned properly while breastfeeding. If engorgement persists after 48 hours of following these recommendations, contact your health care provider or a lactation consultant. OVERALL HEALTH CARE RECOMMENDATIONS WHILE BREASTFEEDING  Eat healthy foods. Alternate between meals and snacks, eating 3 of each per day. Because what you eat affects your breast milk, some of the foods may make your baby more irritable than usual. Avoid eating these foods if you are sure that they are negatively affecting your baby.  Drink milk, fruit juice, and water to satisfy your thirst (about 10 glasses a day).   Rest often, relax, and continue to take your prenatal vitamins to prevent fatigue, stress, and anemia.  Continue  breast self-awareness checks.  Avoid chewing and smoking tobacco.  Avoid alcohol and drug use. Some medicines that may be harmful to your baby can pass through breast milk. It is important to ask your health care provider before taking any medicine, including all over-the-counter and prescription medicine as well as vitamin and herbal supplements. It is possible to become pregnant while breastfeeding. If birth control is desired, ask your health care provider about options that will be safe for your baby. SEEK MEDICAL CARE IF:   You feel like you want to stop breastfeeding or have become frustrated with breastfeeding.  You have painful breasts or nipples.  Your nipples are cracked or bleeding.  Your breasts are red, tender, or warm.  You have a swollen area on either breast.  You have a fever or chills.  You have nausea or vomiting.  You have drainage other than breast milk from your nipples.  Your breasts do not become full before feedings by the fifth day after you give birth.  You feel sad and depressed.  Your baby is too sleepy to eat well.  Your baby is having trouble sleeping.   Your baby is wetting less than 3 diapers in a 24-hour period.  Your baby has less than 3 stools in a 24-hour period.  Your baby's skin or the white part of his or her eyes becomes yellow.   Your baby is not gaining weight by 5 days of age. SEEK IMMEDIATE MEDICAL CARE IF:   Your baby is overly tired (lethargic) and does not want to wake up and feed.  Your baby develops an unexplained fever. Document Released: 08/25/2005 Document Revised: 08/30/2013 Document Reviewed: 02/16/2013 [employer] Patient Information 2015 [employer]. This information is not intended to replace advice given to you by your health care provider. Make sure you discuss any questions you have with your health care provider.  

## 2014-06-15 ENCOUNTER — Inpatient Hospital Stay (HOSPITAL_COMMUNITY)
Admission: AD | Admit: 2014-06-15 | Discharge: 2014-06-17 | DRG: 765 | Disposition: A | Discharge status: Home / Self Care | Payer: Medicaid Other | Source: Ambulatory Visit | Attending: Obstetrics & Gynecology | Admitting: Obstetrics & Gynecology

## 2014-06-15 ENCOUNTER — Encounter (HOSPITAL_COMMUNITY): Payer: Medicaid Other | Admitting: Anesthesiology

## 2014-06-15 ENCOUNTER — Encounter (HOSPITAL_COMMUNITY)
Admission: AD | Disposition: A | Discharge status: Home / Self Care | Payer: Self-pay | Source: Ambulatory Visit | Attending: Obstetrics & Gynecology

## 2014-06-15 ENCOUNTER — Encounter (HOSPITAL_COMMUNITY): Payer: Self-pay | Admitting: *Deleted

## 2014-06-15 ENCOUNTER — Inpatient Hospital Stay (HOSPITAL_COMMUNITY): Payer: Medicaid Other | Admitting: Anesthesiology

## 2014-06-15 ENCOUNTER — Encounter (HOSPITAL_COMMUNITY): Payer: Self-pay | Admitting: Anesthesiology

## 2014-06-15 ENCOUNTER — Inpatient Hospital Stay (HOSPITAL_COMMUNITY): Payer: Medicaid Other

## 2014-06-15 DIAGNOSIS — Z8249 Family history of ischemic heart disease and other diseases of the circulatory system: Secondary | ICD-10-CM | POA: Diagnosis not present

## 2014-06-15 DIAGNOSIS — Z3A39 39 weeks gestation of pregnancy: Secondary | ICD-10-CM

## 2014-06-15 DIAGNOSIS — Z3493 Encounter for supervision of normal pregnancy, unspecified, third trimester: Secondary | ICD-10-CM

## 2014-06-15 DIAGNOSIS — O321XX0 Maternal care for breech presentation, not applicable or unspecified: Secondary | ICD-10-CM

## 2014-06-15 DIAGNOSIS — IMO0001 Reserved for inherently not codable concepts without codable children: Secondary | ICD-10-CM

## 2014-06-15 DIAGNOSIS — O328XX0 Maternal care for other malpresentation of fetus, not applicable or unspecified: Secondary | ICD-10-CM | POA: Diagnosis present

## 2014-06-15 DIAGNOSIS — Z3689 Encounter for other specified antenatal screening: Secondary | ICD-10-CM

## 2014-06-15 DIAGNOSIS — O368130 Decreased fetal movements, third trimester, not applicable or unspecified: Secondary | ICD-10-CM | POA: Diagnosis present

## 2014-06-15 DIAGNOSIS — Z3483 Encounter for supervision of other normal pregnancy, third trimester: Secondary | ICD-10-CM

## 2014-06-15 HISTORY — DX: Other specified health status: Z78.9

## 2014-06-15 LAB — CBC
HCT: 40.3 % (ref 36.0–46.0)
HCT: 27 % — ABNORMAL LOW (ref 36.0–46.0)
HEMOGLOBIN: 14 g/dL (ref 12.0–15.0)
Hemoglobin: 9.2 g/dL — ABNORMAL LOW (ref 12.0–15.0)
MCH: 30.2 pg (ref 26.0–34.0)
MCH: 29.9 pg (ref 26.0–34.0)
MCHC: 34.7 g/dL (ref 30.0–36.0)
MCHC: 34.1 g/dL (ref 30.0–36.0)
MCV: 87 fL (ref 78.0–100.0)
MCV: 87.7 fL (ref 78.0–100.0)
PLATELETS: 177 10*3/uL (ref 150–400)
Platelets: 205 10*3/uL (ref 150–400)
RBC: 4.63 MIL/uL (ref 3.87–5.11)
RBC: 3.08 MIL/uL — AB (ref 3.87–5.11)
RDW: 12.6 % (ref 11.5–15.5)
RDW: 12.6 % (ref 11.5–15.5)
WBC: 11.8 10*3/uL — ABNORMAL HIGH (ref 4.0–10.5)
WBC: 22 10*3/uL — ABNORMAL HIGH (ref 4.0–10.5)

## 2014-06-15 LAB — RPR

## 2014-06-15 LAB — HIV ANTIBODY (ROUTINE TESTING W REFLEX): HIV: NONREACTIVE

## 2014-06-15 SURGERY — Surgical Case
Anesthesia: Spinal

## 2014-06-15 MED ORDER — SCOPOLAMINE 1 MG/3DAYS TD PT72
1.0000 | MEDICATED_PATCH | Freq: Once | TRANSDERMAL | Status: DC
  Administered 2014-06-15: 1.5 mg via TRANSDERMAL

## 2014-06-15 MED ORDER — MEPERIDINE HCL 25 MG/ML IJ SOLN: 6.2500 mg | INTRAMUSCULAR | Status: DC | PRN

## 2014-06-15 MED ORDER — LACTATED RINGERS IV SOLN
INTRAVENOUS | Status: DC
Start: 2014-06-15 — End: 2014-06-15

## 2014-06-15 MED ORDER — BUPIVACAINE IN DEXTROSE 0.75-8.25 % IT SOLN
INTRATHECAL | Status: DC | PRN
  Administered 2014-06-15: 1.5 mL via INTRATHECAL

## 2014-06-15 MED ORDER — MEPERIDINE HCL 25 MG/ML IJ SOLN
6.2500 mg | INTRAMUSCULAR | Status: DC | PRN
Start: 2014-06-15 — End: 2014-06-15

## 2014-06-15 MED ORDER — OXYTOCIN 10 UNIT/ML IJ SOLN
INTRAMUSCULAR | Status: AC
  Filled 2014-06-15: qty 4

## 2014-06-15 MED ORDER — MIDAZOLAM HCL 2 MG/2ML IJ SOLN: 0.5000 mg | Freq: Once | INTRAMUSCULAR | Status: DC | PRN

## 2014-06-15 MED ORDER — SIMETHICONE 80 MG PO CHEW
80.0000 mg | CHEWABLE_TABLET | ORAL | Status: DC
Start: 2014-06-16 — End: 2014-06-17
  Administered 2014-06-16 (×2): 80 mg via ORAL
  Filled 2014-06-15 (×2): qty 1

## 2014-06-15 MED ORDER — LACTATED RINGERS IV SOLN
INTRAVENOUS | Status: DC | PRN
  Administered 2014-06-15 (×3): via INTRAVENOUS

## 2014-06-15 MED ORDER — OXYCODONE-ACETAMINOPHEN 5-325 MG PO TABS
2.0000 | ORAL_TABLET | ORAL | Status: DC | PRN
  Administered 2014-06-16: 2 via ORAL
  Filled 2014-06-15: qty 2

## 2014-06-15 MED ORDER — EPHEDRINE 5 MG/ML INJ: 10.0000 mg | INTRAVENOUS | Status: DC | PRN

## 2014-06-15 MED ORDER — NALBUPHINE HCL 10 MG/ML IJ SOLN: 5.0000 mg | Freq: Once | INTRAMUSCULAR | Status: AC | PRN

## 2014-06-15 MED ORDER — TETANUS-DIPHTH-ACELL PERTUSSIS 5-2.5-18.5 LF-MCG/0.5 IM SUSP: 0.5000 mL | Freq: Once | INTRAMUSCULAR | Status: DC

## 2014-06-15 MED ORDER — ONDANSETRON HCL 4 MG/2ML IJ SOLN: 4.0000 mg | Freq: Three times a day (TID) | INTRAMUSCULAR | Status: DC | PRN

## 2014-06-15 MED ORDER — LACTATED RINGERS IV SOLN: INTRAVENOUS | Status: DC

## 2014-06-15 MED ORDER — WITCH HAZEL-GLYCERIN EX PADS: 1.0000 "application " | MEDICATED_PAD | CUTANEOUS | Status: DC | PRN

## 2014-06-15 MED ORDER — MEPERIDINE HCL 25 MG/ML IJ SOLN
INTRAMUSCULAR | Status: DC | PRN
  Administered 2014-06-15 (×2): 12.5 mg via INTRAVENOUS

## 2014-06-15 MED ORDER — ONDANSETRON HCL 4 MG/2ML IJ SOLN
INTRAMUSCULAR | Status: DC | PRN
  Administered 2014-06-15: 4 mg via INTRAVENOUS

## 2014-06-15 MED ORDER — SIMETHICONE 80 MG PO CHEW: 80.0000 mg | CHEWABLE_TABLET | ORAL | Status: DC | PRN

## 2014-06-15 MED ORDER — OXYCODONE-ACETAMINOPHEN 5-325 MG PO TABS: 2.0000 | ORAL_TABLET | ORAL | Status: DC | PRN

## 2014-06-15 MED ORDER — SIMETHICONE 80 MG PO CHEW
80.0000 mg | CHEWABLE_TABLET | Freq: Three times a day (TID) | ORAL | Status: DC
  Administered 2014-06-16 – 2014-06-17 (×2): 80 mg via ORAL
  Filled 2014-06-15 (×3): qty 1

## 2014-06-15 MED ORDER — KETOROLAC TROMETHAMINE 30 MG/ML IJ SOLN: 30.0000 mg | Freq: Four times a day (QID) | INTRAMUSCULAR | Status: DC | PRN

## 2014-06-15 MED ORDER — DIPHENHYDRAMINE HCL 50 MG/ML IJ SOLN: 12.5000 mg | INTRAMUSCULAR | Status: DC | PRN

## 2014-06-15 MED ORDER — OXYCODONE-ACETAMINOPHEN 5-325 MG PO TABS
1.0000 | ORAL_TABLET | ORAL | Status: DC | PRN
  Administered 2014-06-16 – 2014-06-17 (×4): 1 via ORAL
  Filled 2014-06-15 (×5): qty 1

## 2014-06-15 MED ORDER — PHENYLEPHRINE 8 MG IN D5W 100 ML (0.08MG/ML) PREMIX OPTIME
INJECTION | INTRAVENOUS | Status: DC | PRN
  Administered 2014-06-15: 60 ug/min via INTRAVENOUS

## 2014-06-15 MED ORDER — DIPHENHYDRAMINE HCL 25 MG PO CAPS
25.0000 mg | ORAL_CAPSULE | Freq: Four times a day (QID) | ORAL | Status: DC | PRN
Start: 2014-06-15 — End: 2014-06-17

## 2014-06-15 MED ORDER — IBUPROFEN 600 MG PO TABS: 600.0000 mg | ORAL_TABLET | Freq: Four times a day (QID) | ORAL | Status: DC | PRN

## 2014-06-15 MED ORDER — ONDANSETRON HCL 4 MG/2ML IJ SOLN: 4.0000 mg | Freq: Four times a day (QID) | INTRAMUSCULAR | Status: DC | PRN

## 2014-06-15 MED ORDER — BUPIVACAINE HCL (PF) 0.5 % IJ SOLN
INTRAMUSCULAR | Status: AC
  Filled 2014-06-15: qty 30

## 2014-06-15 MED ORDER — LACTATED RINGERS IV SOLN
40.0000 [IU] | INTRAVENOUS | Status: DC | PRN
  Administered 2014-06-15: 40 [IU] via INTRAVENOUS

## 2014-06-15 MED ORDER — DIPHENHYDRAMINE HCL 25 MG PO CAPS: 25.0000 mg | ORAL_CAPSULE | ORAL | Status: DC | PRN

## 2014-06-15 MED ORDER — ONDANSETRON HCL 4 MG PO TABS: 4.0000 mg | ORAL_TABLET | ORAL | Status: DC | PRN

## 2014-06-15 MED ORDER — FENTANYL 2.5 MCG/ML BUPIVACAINE 1/10 % EPIDURAL INFUSION (WH - ANES): 14.0000 mL/h | INTRAMUSCULAR | Status: DC | PRN

## 2014-06-15 MED ORDER — PHENYLEPHRINE 40 MCG/ML (10ML) SYRINGE FOR IV PUSH (FOR BLOOD PRESSURE SUPPORT): 80.0000 ug | PREFILLED_SYRINGE | INTRAVENOUS | Status: DC | PRN

## 2014-06-15 MED ORDER — LIDOCAINE HCL (PF) 1 % IJ SOLN: 30.0000 mL | INTRAMUSCULAR | Status: DC | PRN

## 2014-06-15 MED ORDER — FENTANYL CITRATE 0.05 MG/ML IJ SOLN
INTRAMUSCULAR | Status: AC
  Filled 2014-06-15: qty 2

## 2014-06-15 MED ORDER — FENTANYL CITRATE 0.05 MG/ML IJ SOLN
INTRAMUSCULAR | Status: DC | PRN
  Administered 2014-06-15: 25 ug via INTRATHECAL

## 2014-06-15 MED ORDER — SCOPOLAMINE 1 MG/3DAYS TD PT72
MEDICATED_PATCH | TRANSDERMAL | Status: AC
  Filled 2014-06-15: qty 1

## 2014-06-15 MED ORDER — LACTATED RINGERS IV SOLN: 500.0000 mL | INTRAVENOUS | Status: DC | PRN

## 2014-06-15 MED ORDER — MEPERIDINE HCL 25 MG/ML IJ SOLN
INTRAMUSCULAR | Status: AC
  Filled 2014-06-15: qty 1

## 2014-06-15 MED ORDER — NALBUPHINE HCL 10 MG/ML IJ SOLN: 5.0000 mg | INTRAMUSCULAR | Status: DC | PRN

## 2014-06-15 MED ORDER — CEFAZOLIN SODIUM-DEXTROSE 2-3 GM-% IV SOLR
2.0000 g | Freq: Once | INTRAVENOUS | Status: AC
  Administered 2014-06-15: 2 g via INTRAVENOUS
  Filled 2014-06-15: qty 50

## 2014-06-15 MED ORDER — SENNOSIDES-DOCUSATE SODIUM 8.6-50 MG PO TABS
2.0000 | ORAL_TABLET | ORAL | Status: DC
  Administered 2014-06-16 (×2): 2 via ORAL
  Filled 2014-06-15 (×2): qty 2

## 2014-06-15 MED ORDER — OXYCODONE-ACETAMINOPHEN 5-325 MG PO TABS: 1.0000 | ORAL_TABLET | ORAL | Status: DC | PRN

## 2014-06-15 MED ORDER — BUPIVACAINE HCL (PF) 0.5 % IJ SOLN
INTRAMUSCULAR | Status: DC | PRN
  Administered 2014-06-15: 30 mL

## 2014-06-15 MED ORDER — MENTHOL 3 MG MT LOZG: 1.0000 | LOZENGE | OROMUCOSAL | Status: DC | PRN

## 2014-06-15 MED ORDER — PHENYLEPHRINE HCL 10 MG/ML IJ SOLN
INTRAMUSCULAR | Status: DC | PRN
  Administered 2014-06-15 (×5): 40 ug via INTRAVENOUS

## 2014-06-15 MED ORDER — OXYTOCIN 40 UNITS IN LACTATED RINGERS INFUSION - SIMPLE MED: 62.5000 mL/h | INTRAVENOUS | Status: AC

## 2014-06-15 MED ORDER — LACTATED RINGERS IV SOLN
INTRAVENOUS | Status: DC
  Administered 2014-06-15 – 2014-06-16 (×2): via INTRAVENOUS

## 2014-06-15 MED ORDER — PHENYLEPHRINE HCL 10 MG/ML IJ SOLN
INTRAMUSCULAR | Status: AC
  Filled 2014-06-15: qty 1

## 2014-06-15 MED ORDER — ACETAMINOPHEN 500 MG PO TABS: 1000.0000 mg | ORAL_TABLET | Freq: Four times a day (QID) | ORAL | Status: AC

## 2014-06-15 MED ORDER — ZOLPIDEM TARTRATE 5 MG PO TABS: 5.0000 mg | ORAL_TABLET | Freq: Every evening | ORAL | Status: DC | PRN

## 2014-06-15 MED ORDER — FENTANYL CITRATE 0.05 MG/ML IJ SOLN: 50.0000 ug | INTRAMUSCULAR | Status: DC | PRN

## 2014-06-15 MED ORDER — MEASLES, MUMPS & RUBELLA VAC ~~LOC~~ INJ
0.5000 mL | INJECTION | Freq: Once | SUBCUTANEOUS | Status: DC
Start: 2014-06-16 — End: 2014-06-16
  Filled 2014-06-15: qty 0.5

## 2014-06-15 MED ORDER — PROMETHAZINE HCL 25 MG/ML IJ SOLN: 6.2500 mg | INTRAMUSCULAR | Status: DC | PRN

## 2014-06-15 MED ORDER — LANOLIN HYDROUS EX OINT
1.0000 | TOPICAL_OINTMENT | CUTANEOUS | Status: DC | PRN
Start: 2014-06-15 — End: 2014-06-17

## 2014-06-15 MED ORDER — ACETAMINOPHEN 325 MG PO TABS
650.0000 mg | ORAL_TABLET | ORAL | Status: DC | PRN
Start: 2014-06-15 — End: 2014-06-15

## 2014-06-15 MED ORDER — DEXTROSE 5 % IV SOLN
1.0000 ug/kg/h | INTRAVENOUS | Status: DC | PRN
  Filled 2014-06-15: qty 2

## 2014-06-15 MED ORDER — FENTANYL CITRATE 0.05 MG/ML IJ SOLN: 25.0000 ug | INTRAMUSCULAR | Status: DC | PRN

## 2014-06-15 MED ORDER — KETOROLAC TROMETHAMINE 30 MG/ML IJ SOLN
INTRAMUSCULAR | Status: AC
  Filled 2014-06-15: qty 1

## 2014-06-15 MED ORDER — MORPHINE SULFATE 0.5 MG/ML IJ SOLN
INTRAMUSCULAR | Status: AC
  Filled 2014-06-15: qty 10

## 2014-06-15 MED ORDER — CITRIC ACID-SODIUM CITRATE 334-500 MG/5ML PO SOLN
30.0000 mL | ORAL | Status: DC | PRN
Start: 2014-06-15 — End: 2014-06-15
  Administered 2014-06-15: 30 mL via ORAL
  Filled 2014-06-15: qty 15

## 2014-06-15 MED ORDER — LACTATED RINGERS IV SOLN: 500.0000 mL | Freq: Once | INTRAVENOUS | Status: DC

## 2014-06-15 MED ORDER — KETOROLAC TROMETHAMINE 30 MG/ML IJ SOLN
30.0000 mg | Freq: Four times a day (QID) | INTRAMUSCULAR | Status: DC | PRN
  Administered 2014-06-15: 30 mg via INTRAMUSCULAR

## 2014-06-15 MED ORDER — KETOROLAC TROMETHAMINE 30 MG/ML IJ SOLN: 30.0000 mg | Freq: Once | INTRAMUSCULAR | Status: DC

## 2014-06-15 MED ORDER — NALBUPHINE HCL 10 MG/ML IJ SOLN
5.0000 mg | Freq: Once | INTRAMUSCULAR | Status: AC | PRN
Start: 2014-06-15 — End: 2014-06-15

## 2014-06-15 MED ORDER — ONDANSETRON HCL 4 MG/2ML IJ SOLN: 4.0000 mg | INTRAMUSCULAR | Status: DC | PRN

## 2014-06-15 MED ORDER — SODIUM CHLORIDE 0.9 % IJ SOLN: 3.0000 mL | INTRAMUSCULAR | Status: DC | PRN

## 2014-06-15 MED ORDER — ONDANSETRON HCL 4 MG/2ML IJ SOLN
INTRAMUSCULAR | Status: AC
  Filled 2014-06-15: qty 2

## 2014-06-15 MED ORDER — OXYTOCIN 40 UNITS IN LACTATED RINGERS INFUSION - SIMPLE MED: 62.5000 mL/h | INTRAVENOUS | Status: DC

## 2014-06-15 MED ORDER — DIBUCAINE 1 % RE OINT: 1.0000 "application " | TOPICAL_OINTMENT | RECTAL | Status: DC | PRN

## 2014-06-15 MED ORDER — NALOXONE HCL 0.4 MG/ML IJ SOLN: 0.4000 mg | INTRAMUSCULAR | Status: DC | PRN

## 2014-06-15 MED ORDER — OXYTOCIN BOLUS FROM INFUSION: 500.0000 mL | INTRAVENOUS | Status: DC

## 2014-06-15 MED ORDER — LACTATED RINGERS IV SOLN
INTRAVENOUS | Status: DC | PRN
  Administered 2014-06-15: 14:00:00 via INTRAVENOUS

## 2014-06-15 MED ORDER — PRENATAL MULTIVITAMIN CH
1.0000 | ORAL_TABLET | Freq: Every day | ORAL | Status: DC
Start: 2014-06-16 — End: 2014-06-17
  Administered 2014-06-16 – 2014-06-17 (×2): 1 via ORAL
  Filled 2014-06-15 (×2): qty 1

## 2014-06-15 MED ORDER — PHENYLEPHRINE 40 MCG/ML (10ML) SYRINGE FOR IV PUSH (FOR BLOOD PRESSURE SUPPORT)
PREFILLED_SYRINGE | INTRAVENOUS | Status: AC
  Filled 2014-06-15: qty 10

## 2014-06-15 MED ORDER — PRENATAL MULTIVITAMIN CH: 1.0000 | ORAL_TABLET | Freq: Every day | ORAL | Status: DC

## 2014-06-15 MED ORDER — MORPHINE SULFATE (PF) 0.5 MG/ML IJ SOLN
INTRAMUSCULAR | Status: DC | PRN
  Administered 2014-06-15: .15 mg via INTRATHECAL

## 2014-06-15 MED ORDER — IBUPROFEN 600 MG PO TABS
600.0000 mg | ORAL_TABLET | Freq: Four times a day (QID) | ORAL | Status: DC
  Administered 2014-06-16 – 2014-06-17 (×6): 600 mg via ORAL
  Filled 2014-06-15 (×7): qty 1

## 2014-06-15 MED ORDER — FLEET ENEMA 7-19 GM/118ML RE ENEM: 1.0000 | ENEMA | RECTAL | Status: DC | PRN

## 2014-06-15 SURGICAL SUPPLY — 44 items
BARRIER ADHS 3X4 INTERCEED (GAUZE/BANDAGES/DRESSINGS) ×3 IMPLANT
BENZOIN TINCTURE PRP APPL 2/3 (GAUZE/BANDAGES/DRESSINGS) ×3 IMPLANT
BINDER ABD UNIV 10 28-50 (GAUZE/BANDAGES/DRESSINGS) ×1 IMPLANT
BINDER ABD UNIV 12 45-62 (WOUND CARE) IMPLANT
BINDER ABDOM UNIV 10 (GAUZE/BANDAGES/DRESSINGS) ×3
BINDER ABDOMINAL 46IN 62IN (WOUND CARE)
CLAMP CORD UMBIL (MISCELLANEOUS) IMPLANT
CLOSURE WOUND 1/2 X4 (GAUZE/BANDAGES/DRESSINGS) ×1
CLOTH BEACON ORANGE TIMEOUT ST (SAFETY) ×3 IMPLANT
COVER LIGHT HANDLE  1/PK (MISCELLANEOUS) ×4
COVER LIGHT HANDLE 1/PK (MISCELLANEOUS) ×2 IMPLANT
DRAPE SHEET LG 3/4 BI-LAMINATE (DRAPES) IMPLANT
DRSG OPSITE 11X17.75 LRG (GAUZE/BANDAGES/DRESSINGS) ×3 IMPLANT
DRSG OPSITE POSTOP 4X10 (GAUZE/BANDAGES/DRESSINGS) ×3 IMPLANT
DURAPREP 26ML APPLICATOR (WOUND CARE) ×3 IMPLANT
ELECT REM PT RETURN 9FT ADLT (ELECTROSURGICAL) ×3
ELECTRODE REM PT RTRN 9FT ADLT (ELECTROSURGICAL) ×1 IMPLANT
EXTRACTOR VACUUM KIWI (MISCELLANEOUS) IMPLANT
GLOVE BIO SURGEON STRL SZ7 (GLOVE) ×3 IMPLANT
GLOVE BIOGEL PI IND STRL 7.0 (GLOVE) ×1 IMPLANT
GLOVE BIOGEL PI INDICATOR 7.0 (GLOVE) ×2
GOWN STRL REUS W/TWL LRG LVL3 (GOWN DISPOSABLE) ×6 IMPLANT
KIT ABG SYR 3ML LUER SLIP (SYRINGE) IMPLANT
NEEDLE HYPO 22GX1.5 SAFETY (NEEDLE) ×3 IMPLANT
NEEDLE HYPO 25X5/8 SAFETYGLIDE (NEEDLE) IMPLANT
NS IRRIG 1000ML POUR BTL (IV SOLUTION) ×3 IMPLANT
PACK C SECTION WH (CUSTOM PROCEDURE TRAY) ×3 IMPLANT
PAD ABD 8X7 1/2 STERILE (GAUZE/BANDAGES/DRESSINGS) ×3 IMPLANT
PAD OB MATERNITY 4.3X12.25 (PERSONAL CARE ITEMS) ×3 IMPLANT
RTRCTR C-SECT PINK 25CM LRG (MISCELLANEOUS) IMPLANT
SPONGE SURGIFOAM ABS GEL 12-7 (HEMOSTASIS) IMPLANT
STAPLER VISISTAT 35W (STAPLE) IMPLANT
STRIP CLOSURE SKIN 1/2X4 (GAUZE/BANDAGES/DRESSINGS) ×2 IMPLANT
SUT PDS AB 0 CTX 60 (SUTURE) IMPLANT
SUT PLAIN 0 NONE (SUTURE) IMPLANT
SUT SILK 0 TIES 10X30 (SUTURE) IMPLANT
SUT VIC AB 0 CT1 36 (SUTURE) ×15 IMPLANT
SUT VIC AB 3-0 CT1 27 (SUTURE) ×2
SUT VIC AB 3-0 CT1 TAPERPNT 27 (SUTURE) ×1 IMPLANT
SUT VIC AB 4-0 KS 27 (SUTURE) IMPLANT
SYR CONTROL 10ML LL (SYRINGE) ×3 IMPLANT
TOWEL OR 17X24 6PK STRL BLUE (TOWEL DISPOSABLE) ×3 IMPLANT
TRAY FOLEY CATH 14FR (SET/KITS/TRAYS/PACK) ×3 IMPLANT
WATER STERILE IRR 1000ML POUR (IV SOLUTION) ×3 IMPLANT

## 2014-06-15 NOTE — Op Note (Addendum)
06/15/2014  4:10 PM  PATIENT:  Sarah Blevins  33 y.o. female  PRE-OPERATIVE DIAGNOSIS:  primary cesarean section for footling breech  POST-OPERATIVE DIAGNOSIS:  primary cesarean section for footling breech  PROCEDURE:  Procedure(s): CESAREAN SECTION (N/A)  SURGEON:  Surgeon(s) and Role:    * Willodean Rosenthalarolyn Harraway-Smith, MD - Primary  ASSISTANTS: Derrell LollingNoah Wolhert, MD, PGY-3   ANESTHESIA:   spinal  EBL:  Total I/O In: 3500 [I.V.:3500] Out: 2275 [Urine:275; Blood:2000]  BLOOD ADMINISTERED:none  DRAINS: none   LOCAL MEDICATIONS USED:  MARCAINE      SPECIMEN: placenta  DISPOSITION OF SPECIMEN:  L&D  COUNTS:  YES  TOURNIQUET:  * No tourniquets in log *  DICTATION: .Note written in EPIC  PLAN OF CARE: Admit to inpatient   PATIENT DISPOSITION:  PACU - hemodynamically stable.   Delay start of Pharmacological VTE agent (>24hrs) due to surgical blood loss or risk of bleeding: yes  Complications: none immediate   INDICATIONS: Sarah Blevins is a 33 y.o. G2P2002 at 22100w0d here for cesarean section secondary to the indications listed under preoperative diagnosis; please see preoperative note for further details.  The risks of cesarean section were discussed with the patient including but were not limited to: bleeding which may require transfusion or reoperation; infection which may require antibiotics; injury to bowel, bladder, ureters or other surrounding organs; injury to the fetus; need for additional procedures including hysterectomy in the event of a life-threatening hemorrhage; placental abnormalities wth subsequent pregnancies, incisional problems, thromboembolic phenomenon and other postoperative/anesthesia complications.   The patient concurred with the proposed plan, giving informed written consent for the procedure.    FINDINGS:  Viable female infant in breech presentation.  Apgars: pending.  Clear amniotic fluid.  Intact placenta, three vessel cord.  Normal uterus, fallopian  tubes and ovaries bilaterally.  PROCEDURE IN DETAIL:  The patient preoperatively received intravenous antibiotics and had sequential compression devices applied to her lower extremities.  She was then taken to the operating room where spinal anesthesia was administered and was found to be adequate. She was then placed in a dorsal supine position with a leftward tilt, and prepped and draped in a sterile manner.  A foley catheter was placed into her bladder and attached to constant gravity.  After an adequate timeout was performed, a Pfannenstiel skin incision was made with scalpel and carried through to the underlying layer of fascia. The fascia was incised in the midline, and this incision was extended bilaterally using the Mayo scissors.  Kocher clamps were applied to the superior aspect of the fascial incision and the underlying rectus muscles were dissected off bluntly. A similar process was carried out on the inferior aspect of the fascial incision. The rectus muscles were separated in the midline bluntly and the peritoneum was entered bluntly. A bladder flap was created with the Metzenbaum scissors and extended laterally with the same. Attention was turned to the lower uterine segment where a low transverse hysterotomy incision was made with a scalpel and extended bilaterally bluntly.  The infants buttocks, legs and arms were delivered successfully then the head was noted to be entrapped and a low T was made of the uterine incision.  The head was still not easily delivered and another low vertical incision was made to the midline.  This allowed for atraumatic delivery of the fetal head. The cord was clamped and cut and the infant was handed over to awaiting neonatology team. Uterine massage was then administered, and the placenta delivered  intact with a three-vessel cord. The uterus was then cleared of clot and debris.  After both vertical extensions were repaired in in 2 layers. The hysterotomy was closed  with 0 Vicryl in a running locked fashion, and an imbricating layer was also placed with the same suture. The uterus was returned to the pelvis. The pelvis was cleared of all clot and debris. Hemostasis was confirmed on all surfaces.  The peritoneum and the muscles were reapproximated using 0 Vicryl in 1 suture. The fascia was then closed using 0 Vicryl in a single suture.  The subcutaneous layer was irrigated, then reapproximated with 3-0 vicryl in a running locked fashion, and the skin was closed with a 4-0 Vicryl subcuticular stitch.  30 cc of 0.5% marcaine was injected into the incision and benzoin and steristrips were applied.  The patient tolerated the procedure well. Sponge, lap, instrument and needle counts were correct x 2.  She was taken to the recovery room in stable condition.

## 2014-06-15 NOTE — Progress Notes (Signed)
Pt  Brought in stat.

## 2014-06-15 NOTE — Brief Op Note (Signed)
06/15/2014  4:10 PM  PATIENT:  Sarah Blevins  33 y.o. female  PRE-OPERATIVE DIAGNOSIS:  primary cesarean section for footling breech  POST-OPERATIVE DIAGNOSIS:  primary cesarean section for footling breech  PROCEDURE:  Procedure(s): CESAREAN SECTION (N/A)  SURGEON:  Surgeon(s) and Role:    * Willodean Rosenthalarolyn Harraway-Smith, MD - Primary  ASSISTANTS: Derrell LollingNoah Wolhert, MD, PGY-3   ANESTHESIA:   spinal  EBL:  Total I/O In: 3500 [I.V.:3500] Out: 2275 [Urine:275; Blood:2000]  BLOOD ADMINISTERED:none  DRAINS: none   LOCAL MEDICATIONS USED:  MARCAINE      SPECIMEN: placenta  DISPOSITION OF SPECIMEN:  L&D  COUNTS:  YES  TOURNIQUET:  * No tourniquets in log *  DICTATION: .Note written in EPIC  PLAN OF CARE: Admit to inpatient   PATIENT DISPOSITION:  PACU - hemodynamically stable.   Delay start of Pharmacological VTE agent (>24hrs) due to surgical blood loss or risk of bleeding: yes

## 2014-06-15 NOTE — Transfer of Care (Signed)
Immediate Anesthesia Transfer of Care Note  Patient: Sarah Blevins  Procedure(s) Performed: Procedure(s): CESAREAN SECTION (N/A)  Patient Location: PACU  Anesthesia Type:Spinal  Level of Consciousness: awake, alert , oriented and patient cooperative  Airway & Oxygen Therapy: Patient Spontanous Breathing  Post-op Assessment: Report given to PACU RN and Post -op Vital signs reviewed and stable  Post vital signs: Reviewed and stable  Complications: No apparent anesthesia complications

## 2014-06-15 NOTE — Anesthesia Preprocedure Evaluation (Addendum)
Anesthesia Evaluation  Patient identified by MRN, date of birth, ID band Patient awake    Reviewed: Allergy & Precautions, H&P , Patient's Chart, lab work & pertinent test results  Airway Mallampati: II TM Distance: >3 FB Neck ROM: full    Dental   Pulmonary  breath sounds clear to auscultation        Cardiovascular Rhythm:regular Rate:Normal     Neuro/Psych    GI/Hepatic   Endo/Other    Renal/GU      Musculoskeletal   Abdominal   Peds  Hematology   Anesthesia Other Findings   Reproductive/Obstetrics (+) Pregnancy                           Anesthesia Physical  Anesthesia Plan  ASA: II and emergent  Anesthesia Plan: Spinal   Post-op Pain Management:    Induction:   Airway Management Planned:   Additional Equipment:   Intra-op Plan:   Post-operative Plan:   Informed Consent: I have reviewed the patients History and Physical, chart, labs and discussed the procedure including the risks, benefits and alternatives for the proposed anesthesia with the patient or authorized representative who has indicated his/her understanding and acceptance.     Plan Discussed with:   Anesthesia Plan Comments:        Anesthesia Quick Evaluation

## 2014-06-15 NOTE — Anesthesia Postprocedure Evaluation (Signed)
  Anesthesia Post Note  Patient: Sarah Blevins  Procedure(s) Performed: Procedure(s) (LRB): CESAREAN SECTION (N/A)  Anesthesia type: Spinal  Patient location: PACU  Post pain: Pain level controlled  Post assessment: Post-op Vital signs reviewed  Last Vitals:  Filed Vitals:   06/15/14 1312  BP: 110/71  Pulse: 91  Temp:   Resp: 20    Post vital signs: Reviewed  Level of consciousness: awake  Complications: No apparent anesthesia complications

## 2014-06-15 NOTE — Progress Notes (Signed)
Dr Erin FullingHarraway Smith at pt bedside to discuss primary c-section for double footing breech presentation.  Pt verbalized understanding and signed consent.

## 2014-06-15 NOTE — H&P (Signed)
  Patient desires surgical management with primary cesarean section for doubling footling breech presentation.  The risks of surgery were discussed in detail with the patient including but not limited to: bleeding which may require transfusion or reoperation; infection which may require prolonged hospitalization or re-hospitalization and antibiotic therapy; injury to bowel, bladder, ureters and major vessels or other surrounding organs; need for additional procedures including laparotomy; thromboembolic phenomenon, incisional problems and other postoperative or anesthesia complications.  Patient was told that the likelihood that her condition and symptoms will be treated effectively with this surgical management was very high; the postoperative expectations were also discussed in detail. The patient also understands the alternative treatment options which were discussed in full. All questions were answered.

## 2014-06-15 NOTE — Progress Notes (Signed)
Dr. Erin FullingHarraway Smith called By Nurse midwife Drenda FreezeFran after she inspected the bleeding incision. RN called mid wife first. Dr. Luretha MurphyHS changed steri strips, applied new honey comb and pressure dressing and ordered abdominal binder for pt.

## 2014-06-15 NOTE — OR Nursing (Signed)
Placenta to Labor and Delivary

## 2014-06-15 NOTE — H&P (Signed)
Sarah Blevins is a 33 y.o. female G2P1001 with IUP at [redacted]w[redacted]d presenting with labor. Pt states she has been having regular, every 4-6 minutes contractions, associated with none vaginal bleeding.  Membranes are intact, with decreased  fetal movement.   [facility] at [facility] since 16 wks  Prenatal History/Complications:   Past Medical History: Past Medical History  Diagnosis Date  . No pertinent past medical history   . Medical history non-contributory     Past Surgical History: Past Surgical History  Procedure Laterality Date  . Leg surgery      right leg blood cyst removal.  . Wisdom tooth extraction      x2  . No past surgeries      Obstetrical History: OB History   Grav Para Term Preterm Abortions TAB SAB Ect Mult Living   2 1 1  0 0 0 0 0 0 1     Social History: History   Social History  . Marital Status: Married    Spouse Name: N/A    Number of Children: N/A  . Years of Education: N/A   Social History Main Topics  . Smoking status: Never Smoker   . Smokeless tobacco: None  . Alcohol Use: No  . Drug Use: No  . Sexual Activity: Yes    Birth Control/ Protection: None   Other Topics Concern  . None   Social History Narrative  . None    Family History: Family History  Problem Relation Age of Onset  . Anemia Mother   . Hypertension Father     Allergies: No Known Allergies  Prescriptions prior to admission  Medication Sig Dispense Refill  . cephALEXin (KEFLEX) 500 MG capsule Take 1 capsule (500 mg total) by mouth 4 (four) times daily.  28 capsule  0  . fluconazole (DIFLUCAN) 150 MG tablet Take 1 tablet (150 mg total) by mouth daily.  1 tablet  0  . Prenatal Vit-Fe Fumarate-FA (PRENATAL MULTIVITAMIN) TABS tablet Take 1 tablet by mouth daily at 12 noon.      [name] Kitchen terconazole (TERAZOL 7) 0.4 % vaginal cream Place 1 applicator vaginally at bedtime.  45 g  0     Review of Systems   Constitution: No fevers or chills  Blood pressure 126/73, pulse 91,  temperature 97.6 F (36.4 C), temperature source Oral, resp. rate 20, height 5\' 3"  (1.6 m), weight 65.318 kg (144 lb), last menstrual period 09/15/2013, unknown if currently breastfeeding. General appearance: alert and cooperative, appearing in pain Lungs: clear to auscultation bilaterally Heart: regular rate and rhythm Abdomen: soft, non-tender; bowel sounds normal Extremities: Homans sign is negative, no sign of DVT DTR's normal Presentation: breech Fetal monitoringBaseline: 140 bpm, Variability: Good {> 6 bpm), Accelerations: Reactive and Decelerations: Absent Uterine activityFrequency: Every 3 minutes and Duration: 80-120 seconds Dilation: 8 Effacement (%): 100 Station: Ballotable Exam by:: Ivonne Andrew CNM   Prenatal labs: ABO, Rh: B/POS/-- (05/05 1545) Antibody: NEG (05/05 1545) Rubella:   RPR: NON REAC (08/26 1112)  HBsAg: NEGATIVE (05/05 1545)  HIV: NONREACTIVE (08/26 1112)  GBS:    1 hr Glucola 96 Genetic screening: declined Anatomy US wnl   Prenatal Transfer Tool  Maternal Diabetes: No Genetic Screening: Declined Maternal Ultrasounds/Referrals: Normal Fetal Ultrasounds or other Referrals:  None Maternal Substance Abuse:  No Significant Maternal Medications:  None Significant Maternal Lab Results: None  Results for orders placed during the hospital encounter of 06/15/14 (from the past 24 hour(s))  CBC   Collection Time  06/15/14 12:47 PM      Result Value Ref Range   WBC 11.8 (*) 4.0 - 10.5 K/uL   RBC 4.63  3.87 - 5.11 MIL/uL   Hemoglobin 14.0  12.0 - 15.0 g/dL   HCT 16.140.3  09.636.0 - 04.546.0 %   MCV 87.0  78.0 - 100.0 fL   MCH 30.2  26.0 - 34.0 pg   MCHC 34.7  30.0 - 36.0 g/dL   RDW 40.912.6  81.111.5 - 91.415.5 %   Platelets 205  150 - 400 K/uL    Assessment: Sarah Blevins is a 33 y.o. G2P1001 at 4675w0d here in active labor. Double footling breech presentation confirmed by limited US. #Labor: Breech presentation, feet down, will proceed with  c/s #Pain: epidural #FWB: Category 1 #ID:  GBS negative   Jacquiline DoeParker, Caleb 06/15/2014, 1:05 PM  I was present for the exam and agree with above.  HerreidVirginia Deago Burruss, CNM 06/15/2014 1:38 PM

## 2014-06-15 NOTE — MAU Note (Signed)
Patient taken directly from lobby to MAU room 2 at 1215; Patient crying and appears very uncomfortable. Patient reporting contractions every 3-4 minutes. Denies LOF or vaginal bleeding. +FM. SVE 7/100/ BBOW; patient states was vertex in office this week. Report Called to Dr. Jimmey RalphParker and L&D. Patient taken directly to room 172 for admit via stretcher. EFM tracing reviewed with 2nd RN Heather.

## 2014-06-15 NOTE — Anesthesia Preprocedure Evaluation (Deleted)

## 2014-06-15 NOTE — Anesthesia Procedure Notes (Signed)
Spinal  Patient location during procedure: OR Start time: 06/15/2014 1:19 PM Staffing Anesthesiologist: Sarah Blevins, Layson Bertsch Performed by: anesthesiologist  Preanesthetic Checklist Completed: patient identified, site marked, surgical consent, pre-op evaluation, timeout performed, IV checked, risks and benefits discussed and monitors and equipment checked Spinal Block Patient position: sitting Prep: DuraPrep Patient monitoring: heart rate, cardiac monitor, continuous pulse ox and blood pressure Approach: midline Location: L3-4 Injection technique: single-shot Needle Needle type: Sprotte  Needle gauge: 24 G Needle length: 9 cm Assessment Sensory level: T4 Additional Notes Patient identified.  Risk benefits discussed including failed block, incomplete pain control, headache, nerve damage, paralysis, blood pressure changes, nausea, vomiting, reactions to medication both toxic or allergic, and postpartum back pain.  Patient expressed understanding and wished to proceed.  All questions were answered.  Sterile technique used throughout procedure.  CSF was clear.  No parasthesia or other complications.  Please see nursing notes for vital signs.

## 2014-06-15 NOTE — MAU Note (Signed)
Patient states she is having frequent contractions. Patient to room 2 in MAU.

## 2014-06-15 NOTE — H&P (Signed)
Attestation of Attending Supervision of Advanced Practitioner (CNM/NP): Evaluation and management procedures were performed by the Advanced Practitioner under my supervision and collaboration.  I have reviewed the Advanced Practitioner's note and chart, and I agree with the management and plan.  HARRAWAY-SMITH, Kaitlen Redford 10:16 PM     

## 2014-06-16 ENCOUNTER — Encounter (HOSPITAL_COMMUNITY): Payer: Self-pay | Admitting: Obstetrics & Gynecology

## 2014-06-16 LAB — CBC
HCT: 19.6 % — ABNORMAL LOW (ref 36.0–46.0)
HCT: 26.8 % — ABNORMAL LOW (ref 36.0–46.0)
HEMOGLOBIN: 9.3 g/dL — AB (ref 12.0–15.0)
Hemoglobin: 6.6 g/dL — CL (ref 12.0–15.0)
MCH: 29.7 pg (ref 26.0–34.0)
MCH: 30 pg (ref 26.0–34.0)
MCHC: 33.7 g/dL (ref 30.0–36.0)
MCHC: 34.7 g/dL (ref 30.0–36.0)
MCV: 88.3 fL (ref 78.0–100.0)
MCV: 86.5 fL (ref 78.0–100.0)
PLATELETS: 138 10*3/uL — AB (ref 150–400)
Platelets: 161 10*3/uL (ref 150–400)
RBC: 2.22 MIL/uL — AB (ref 3.87–5.11)
RBC: 3.1 MIL/uL — AB (ref 3.87–5.11)
RDW: 12.6 % (ref 11.5–15.5)
RDW: 14.4 % (ref 11.5–15.5)
WBC: 10.9 10*3/uL — ABNORMAL HIGH (ref 4.0–10.5)
WBC: 15.8 10*3/uL — ABNORMAL HIGH (ref 4.0–10.5)

## 2014-06-16 LAB — PREPARE RBC (CROSSMATCH)

## 2014-06-16 LAB — BIRTH TISSUE RECOVERY COLLECTION (PLACENTA DONATION)

## 2014-06-16 MED ORDER — SODIUM CHLORIDE 0.9 % IV SOLN
Freq: Once | INTRAVENOUS | Status: AC
Start: 2014-06-16 — End: 2014-06-16
  Administered 2014-06-16: 13:00:00 via INTRAVENOUS

## 2014-06-16 NOTE — Progress Notes (Signed)
Subjective: Postpartum Day 1: Cesarean Delivery. PLTCS for double footling presentation Patient reports incisional pain, tolerating PO and + flatus, foley in for voiding. Feels light headed when sitting up, has not gotten out of bed yet. No CP, no SOB, bleeding less than a period. No incisional bleeding since dressing change and binder.  Objective: Vital signs in last 24 hours: Temp:  [97.6 F (36.4 C)-99.3 F (37.4 C)] 98.9 F (37.2 C) (10/09 0430) Pulse Rate:  [70-104] 89 (10/09 0430) Resp:  [15-20] 18 (10/09 0430) BP: (82-126)/(37-73) 90/50 mmHg (10/09 0430) SpO2:  [93 %-97 %] 95 % (10/09 0230) Weight:  [144 lb (65.318 kg)] 144 lb (65.318 kg) (10/08 1235)  Physical Exam:  General: alert, cooperative and no distress CV: RRR, normal s1 and s2, SEM consistent with flow murmur present Resp: CTAB, normal effort Lochia: appropriate Uterine Fundus: firm Incision: no significant drainage, no dehiscence, dressing c/d/i DVT Evaluation: No evidence of DVT seen on physical exam. Negative Homan's sign. No cords or calf tenderness. No significant calf/ankle edema.   Recent Labs  06/15/14 1455 06/16/14 0555  HGB 9.2* 6.6*  HCT 27.0* 19.6*    Assessment/Plan: Status post Cesarean section. Postoperative course complicated by PPH, hemoglobin trending 14->9->6.6 this AM, symptomatic. Discussed possible transfusion with the patient  Continue current care.  Tawni CarnesWight, Andrew 06/16/2014, 8:00 AM  I have seen and examined this patient and agree the above assessment. CRESENZO-DISHMAN,Liley Rake 06/19/2014 7:55 PM

## 2014-06-16 NOTE — Lactation Note (Signed)
This note was copied from the chart of Sarah Bruce DonathMarilyn Minich. Lactation Consultation Note  Patient Name: Sarah Blevins VOZDG'UToday's Date: 06/16/2014 Reason for consult: Initial assessment   Maternal Data Formula Feeding for Exclusion: No Does the patient have breastfeeding experience prior to this delivery?: Yes  Feeding Feeding Type: Breast Fed Length of feed: 10 min (left breast for 10 mins per mom )  LATCH Score/Interventions Latch: Grasps breast easily, tongue down, lips flanged, rhythmical sucking.  Audible Swallowing: Spontaneous and intermittent  Type of Nipple: Everted at rest and after stimulation  Comfort (Breast/Nipple): Soft / non-tender     Hold (Positioning): Assistance needed to correctly position infant at breast and maintain latch.  LATCH Score: 9  Lactation Tools Discussed/Used     Consult Status Consult Status: Follow-up Date: 06/16/14 Follow-up type: In-patient    Kathrin Greathouseorio, Kelsye Loomer Ann 06/16/2014, 2:01 PM

## 2014-06-16 NOTE — Addendum Note (Signed)
Addendum created 06/16/14 0748 by Suella Groveoderick C Cillian Gwinner, CRNA   Modules edited: Notes Section   Notes Section:  File: [id number]

## 2014-06-16 NOTE — Lactation Note (Signed)
This note was copied from the chart of Sarah Bruce DonathMarilyn Godar. Lactation Consultation Note  Patient Name: Sarah Bruce DonathMarilyn Hotard ZOXWR'UToday's Date: 06/16/2014 Reason for consult: Initial assessment mom is an experienced breast feeder x 20 months and still breast feeding older child. This 24 hour female infant has been to the breast several times. Due to mom receiving blood at this time, LC assisted with latch  on the right breast for 10 mins with multiply swallows and the left , still breast feeding when LC left moms side.  Mom has a steady  flow of colostrum when breast massage , and hand express, areola soft and compressible and baby latches easily with depth. Mother informed of post-discharge support and given phone number to the lactation department, including services for phone call  assistance; out-patient appointments; and breastfeeding support group. List of other breastfeeding resources in the community  given in the handout. Encouraged mother to call for problems or concerns related to breastfeeding.   Maternal Data Formula Feeding for Exclusion: No Does the patient have breastfeeding experience prior to this delivery?: Yes  Feeding Feeding Type: Breast Fed Length of feed: 10 min  LATCH Score/Interventions Latch: Grasps breast easily, tongue down, lips flanged, rhythmical sucking.  Audible Swallowing: Spontaneous and intermittent  Type of Nipple: Everted at rest and after stimulation  Comfort (Breast/Nipple): Soft / non-tender     Hold (Positioning): Assistance needed to correctly position infant at breast and maintain latch. Intervention(s): Breastfeeding basics reviewed;Support Pillows;Position options;Skin to skin  LATCH Score: 9  Lactation Tools Discussed/Used     Consult Status Consult Status: Follow-up Date: 06/17/14 Follow-up type: In-patient    Kathrin Greathouseorio, Blakeley Scheier Ann 06/16/2014, 2:12 PM

## 2014-06-16 NOTE — Anesthesia Postprocedure Evaluation (Signed)
  Anesthesia Post-op Note  Patient: Sarah Blevins  Procedure(s) Performed: Procedure(s): CESAREAN SECTION (N/A)  Patient Location: Mother/Baby  Anesthesia Type:Spinal  Level of Consciousness: awake  Airway and Oxygen Therapy: Patient Spontanous Breathing  Post-op Pain: none  Post-op Assessment: Post-op Vital signs reviewed, Patient's Cardiovascular Status Stable, Respiratory Function Stable, Patent Airway, No signs of Nausea or vomiting, Adequate PO intake, Pain level controlled, No headache, No backache, No residual numbness and No residual motor weakness  Post-op Vital Signs: Reviewed and stable  Last Vitals:  Filed Vitals:   06/16/14 0430  BP: 90/50  Pulse: 89  Temp: 37.2 C  Resp: 18    Complications: No apparent anesthesia complications

## 2014-06-17 LAB — TYPE AND SCREEN
ABO/RH(D): B POS
Antibody Screen: NEGATIVE
UNIT DIVISION: 0
Unit division: 0

## 2014-06-17 MED ORDER — IBUPROFEN 600 MG PO TABS: 600.0000 mg | ORAL_TABLET | Freq: Four times a day (QID) | ORAL | Status: DC

## 2014-06-17 MED ORDER — OXYCODONE-ACETAMINOPHEN 5-325 MG PO TABS: 1.0000 | ORAL_TABLET | ORAL | Status: DC | PRN

## 2014-06-17 MED ORDER — DOCUSATE SODIUM 100 MG PO CAPS: 100.0000 mg | ORAL_CAPSULE | Freq: Two times a day (BID) | ORAL | Status: DC

## 2014-06-17 NOTE — Progress Notes (Signed)
Patient unable to walk to BR due to tiredness and dizziness.  MD aware and wants foley left in until 2nd unit of blood finished.

## 2014-06-17 NOTE — Discharge Instructions (Signed)

## 2014-06-17 NOTE — Plan of Care (Signed)
Problem: Phase II Progression Outcomes Goal: Other Phase II Outcomes/Goals Outcome: Completed/Met Date Met:  06/17/14 Ambulating well with no signs of dizziness.  HGB 9.3 after 2 unit of RBC transfusion yesterday.  States ready for discharge

## 2014-06-17 NOTE — Discharge Summary (Signed)
Obstetric Discharge Summary Reason for Admission: onset of labor Prenatal Procedures: none Intrapartum Procedures: cesarean: low cervical, transverse with low T extension Postpartum Procedures: transfusion 2U pRBC Complications-Operative and Postpartum: hemorrhage  Hospital Course:  Patient presented in active labor, and was found to be in double footling breech presentation by limited US and underwent urgent cesarean. Please see the operative note for more details.   Postpartum, there was moderate amount of post partum hemorrhage. The patient's hemoglobin dropped to 6.6 and she complained of dizziness when standing. She was transfused 2U of pRBC which resolved her symptoms.  On the day of discharge,  Pt denied problems with ambulating, voiding or po intake.  She denied nausea or vomiting.  Pain wass moderately controlled.  She has had flatus. She has had bowel movement.  Lochia Small.  Plan for birth control is nexplanon.  Method of Feeding: breast   H/H: Lab Results  Component Value Date/Time   HGB 9.3* 06/16/2014  9:20 PM   HCT 26.8* 06/16/2014  9:20 PM    Filed Vitals:   06/17/14 0540  BP: 82/49  Pulse: 76  Temp: 97.5 F (36.4 C)  Resp: 18    Physical Exam: VSS NAD Abd: Appropriately tender, ND, Fundus firm Incision: dressing in place, clean, dry, and intact No c/c/e, Neg homan's sign, neg cords Lochia Appropriate  Discharge Diagnoses: Term Pregnancy-delivered  Discharge Information: Date: 06/17/2014 Activity: pelvic rest Diet: routine  Medications: PNV, Ibuprofen, Colace and Percocet Breast feeding:  Yes Condition: stable Instructions: refer to handout Discharge to: home      Medication List         docusate sodium 100 MG capsule  Commonly known as:  COLACE  Take 1 capsule (100 mg total) by mouth 2 (two) times daily.     ibuprofen 600 MG tablet  Commonly known as:  ADVIL,MOTRIN  Take 1 tablet (600 mg total) by mouth every 6 (six) hours.     oxyCODONE-acetaminophen 5-325 MG per tablet  Commonly known as:  PERCOCET/ROXICET  Take 1 tablet by mouth every 4 (four) hours as needed (for pain scale less than 7).     prenatal multivitamin Tabs tablet  Take 1 tablet by mouth daily.       Follow-up Information   [facility]. Schedule an appointment as soon as possible for a visit in 6 weeks. (For follow up)    Specialty:  Obstetrics and Gynecology   Contact information:   [address] [place] [id number] [phone]      Jacquiline DoeParker, Caleb 06/17/2014,9:18 AM  I have seen and examined this patient and I agree with the above. Cam HaiSHAW, Rafiel Mecca CNM 6:24 AM 06/22/2014

## 2014-06-21 ENCOUNTER — Telehealth: Payer: Self-pay | Admitting: Advanced Practice Midwife

## 2014-06-21 NOTE — Telephone Encounter (Signed)
Received call from [name], patient's home health nurse regarding patient's symptoms of incisional pain, dizziness, mild edema, normal BP.  Contacted patient to request she come in to be evaluated by physician.  Patient stated she would "wait until her husband got home and call Sarah Blevins back to schedule appointment"

## 2014-06-22 ENCOUNTER — Ambulatory Visit (INDEPENDENT_AMBULATORY_CARE_PROVIDER_SITE_OTHER): Payer: Medicaid Other | Admitting: Obstetrics and Gynecology

## 2014-06-22 VITALS — BP 137/78 | HR 79 | Temp 98.0°F | Ht 63.0 in | Wt 141.0 lb

## 2014-06-22 DIAGNOSIS — Z5189 Encounter for other specified aftercare: Secondary | ICD-10-CM

## 2014-06-22 NOTE — Progress Notes (Signed)
Patient ID: Alvester ChouMarilyn N Eskridge, female   DOB: 03/19/1981, 33 y.o.   MRN: [id number] 33 yo G2P2 s/p primary c-section on 10/8 here for an incision check. Patient reports pain on right side of incision. She denies fever, chills, or abnormal drainage from her incision  Past Medical History  Diagnosis Date  . No pertinent past medical history   . Medical history non-contributory    Past Surgical History  Procedure Laterality Date  . Leg surgery      right leg blood cyst removal.  . Wisdom tooth extraction      x2  . No past surgeries    . Cesarean section N/A 06/15/2014    Procedure: CESAREAN SECTION;  Surgeon: Willodean Rosenthalarolyn Harraway-Smith, MD;  Location: [facility];  Service: Obstetrics;  Laterality: N/A;   Family History  Problem Relation Age of Onset  . Anemia Mother   . Hypertension Father    History  Substance Use Topics  . Smoking status: Never Smoker   . Smokeless tobacco: Not on file  . Alcohol Use: No   GENERAL: Well-developed, well-nourished female in no acute distress.  ABDOMEN: Soft, nontender, nondistended.  INCISION: no erythema, induration or drainage. Steri-strips in place and intact. EXTREMITIES: No cyanosis, clubbing, or edema, 2+ distal pulses.  A/P 33 yo here for incision check - Reassurance provided regarding normal healing course s/p c-section - Advised to take ibuprofen as patient has been solely relying on percocet - Advised to ambulate as patient has been bed bound per husband- encouraged gradual return to regular activities and to avoid heavy lifting until postpartum visit - RTC for postpartum visit and Nexplanon insertion

## 2014-06-22 NOTE — Progress Notes (Signed)
Here today for post operative pain on right side of incision, having dizzy spells and headache.

## 2014-06-26 NOTE — Progress Notes (Signed)
Post discharge chart review completed.  

## 2014-06-27 ENCOUNTER — Other Ambulatory Visit: Payer: Self-pay | Admitting: Family Medicine

## 2014-06-27 MED ORDER — OXYCODONE-ACETAMINOPHEN 5-325 MG PO TABS: 1.0000 | ORAL_TABLET | ORAL | Status: DC | PRN

## 2014-07-05 ENCOUNTER — Encounter: Payer: Self-pay | Admitting: Family Medicine

## 2014-07-05 ENCOUNTER — Ambulatory Visit (INDEPENDENT_AMBULATORY_CARE_PROVIDER_SITE_OTHER): Payer: Medicaid Other | Admitting: Family Medicine

## 2014-07-05 VITALS — BP 113/76 | HR 74 | Ht 63.0 in | Wt 127.0 lb

## 2014-07-05 DIAGNOSIS — M2550 Pain in unspecified joint: Secondary | ICD-10-CM

## 2014-07-05 DIAGNOSIS — Z9889 Other specified postprocedural states: Secondary | ICD-10-CM

## 2014-07-05 MED ORDER — IBUPROFEN 600 MG PO TABS: 600.0000 mg | ORAL_TABLET | Freq: Four times a day (QID) | ORAL | Status: DC

## 2014-07-05 NOTE — Patient Instructions (Signed)
Postpartum Depression and Baby Blues The postpartum period begins right after the birth of a baby. During this time, there is often a great amount of joy and excitement. It is also a time of many changes in the life of the parents. Regardless of how many times a mother gives birth, each child brings new challenges and dynamics to the family. It is not unusual to have feelings of excitement along with confusing shifts in moods, emotions, and thoughts. All mothers are at risk of developing postpartum depression or the "baby blues." These mood changes can occur right after giving birth, or they may occur many months after giving birth. The baby blues or postpartum depression can be mild or severe. Additionally, postpartum depression can go away rather quickly, or it can be a long-term condition.  CAUSES Raised hormone levels and the rapid drop in those levels are thought to be a main cause of postpartum depression and the baby blues. A number of hormones change during and after pregnancy. Estrogen and progesterone usually decrease right after the delivery of your baby. The levels of thyroid hormone and various cortisol steroids also rapidly drop. Other factors that play a role in these mood changes include major life events and genetics.  RISK FACTORS If you have any of the following risks for the baby blues or postpartum depression, know what symptoms to watch out for during the postpartum period. Risk factors that may increase the likelihood of getting the baby blues or postpartum depression include:  Having a personal or family history of depression.   Having depression while being pregnant.   Having premenstrual mood issues or mood issues related to oral contraceptives.  Having a lot of life stress.   Having marital conflict.   Lacking a social support network.   Having a baby with special needs.   Having health problems, such as diabetes.  SIGNS AND SYMPTOMS Symptoms of baby blues  include:  Brief changes in mood, such as going from extreme happiness to sadness.  Decreased concentration.   Difficulty sleeping.   Crying spells, tearfulness.   Irritability.   Anxiety.  Symptoms of postpartum depression typically begin within the first month after giving birth. These symptoms include:  Difficulty sleeping or excessive sleepiness.   Marked weight loss.   Agitation.   Feelings of worthlessness.   Lack of interest in activity or food.  Postpartum psychosis is a very serious condition and can be dangerous. Fortunately, it is rare. Displaying any of the following symptoms is cause for immediate medical attention. Symptoms of postpartum psychosis include:   Hallucinations and delusions.   Bizarre or disorganized behavior.   Confusion or disorientation.  DIAGNOSIS  A diagnosis is made by an evaluation of your symptoms. There are no medical or lab tests that lead to a diagnosis, but there are various questionnaires that a health care provider may use to identify those with the baby blues, postpartum depression, or psychosis. Often, a screening tool called the Edinburgh Postnatal Depression Scale is used to diagnose depression in the postpartum period.  TREATMENT The baby blues usually goes away on its own in 1-2 weeks. Social support is often all that is needed. You will be encouraged to get adequate sleep and rest. Occasionally, you may be given medicines to help you sleep.  Postpartum depression requires treatment because it can last several months or longer if it is not treated. Treatment may include individual or group therapy, medicine, or both to address any social, physiological, and psychological   factors that may play a role in the depression. Regular exercise, a healthy diet, rest, and social support may also be strongly recommended.  Postpartum psychosis is more serious and needs treatment right away. Hospitalization is often needed. HOME CARE  INSTRUCTIONS  Get as much rest as you can. Nap when the baby sleeps.   Exercise regularly. Some women find yoga and walking to be beneficial.   Eat a balanced and nourishing diet.   Do little things that you enjoy. Have a cup of tea, take a bubble bath, read your favorite magazine, or listen to your favorite music.  Avoid alcohol.   Ask for help with household chores, cooking, grocery shopping, or running errands as needed. Do not try to do everything.   Talk to people close to you about how you are feeling. Get support from your partner, family members, friends, or other new moms.  Try to stay positive in how you think. Think about the things you are grateful for.   Do not spend a lot of time alone.   Only take over-the-counter or prescription medicine as directed by your health care provider.  Keep all your postpartum appointments.   Let your health care provider know if you have any concerns.  SEEK MEDICAL CARE IF: You are having a reaction to or problems with your medicine. SEEK IMMEDIATE MEDICAL CARE IF:  You have suicidal feelings.   You think you may harm the baby or someone else. MAKE SURE YOU:  Understand these instructions.  Will watch your condition.  Will get help right away if you are not doing well or get worse. Document Released: 05/29/2004 Document Revised: 08/30/2013 Document Reviewed: 06/06/2013 ExitCare Patient Information 2015 [employer]. This information is not intended to replace advice given to you by your health care provider. Make sure you discuss any questions you have with your health care provider.  

## 2014-07-05 NOTE — Progress Notes (Signed)
    Subjective:    Patient ID: Sarah Blevins is a 33 y.o. female presenting with Postpartum Care  on 07/05/2014  HPI: Here for f/u.  2 wks post Csection for breech with entrapped head.  Baby is doing well.  She continues to have pain.  Describes it to be in SI joints bilaterally. Also, notes some weakness in her knees.  She additionally is sad. Always feels this way immediately following delivery.  Review of Systems  Constitutional: Negative for fever and chills.  Respiratory: Negative for shortness of breath.   Gastrointestinal: Positive for abdominal pain (mild). Negative for abdominal distention.      Objective:    BP 113/76  Pulse 74  Ht 5\' 3"  (1.6 m)  Wt 127 lb (57.607 kg)  BMI 22.50 kg/m2  Breastfeeding? Yes Physical Exam  Constitutional: She appears well-developed and well-nourished.  Abdominal: Soft. There is no tenderness.  Skin: Skin is warm.  Incision is well healed, steri-strips in place        Assessment & Plan:   Will give her a Nexplanon at her pp visit. Re-visit depression then as well. Ibuprofen refilled. Pain, joint, multiple sites   Return in about 4 weeks (around 08/02/2014) for pp check.

## 2014-07-10 ENCOUNTER — Encounter: Payer: Self-pay | Admitting: Family Medicine

## 2014-08-02 ENCOUNTER — Ambulatory Visit: Payer: Medicaid Other | Admitting: Family Medicine

## 2014-08-30 ENCOUNTER — Ambulatory Visit: Payer: Medicaid Other | Admitting: Obstetrics and Gynecology

## 2014-08-30 ENCOUNTER — Ambulatory Visit (INDEPENDENT_AMBULATORY_CARE_PROVIDER_SITE_OTHER): Payer: Medicaid Other | Admitting: Obstetrics and Gynecology

## 2014-08-30 ENCOUNTER — Other Ambulatory Visit (HOSPITAL_COMMUNITY)
Admission: RE | Admit: 2014-08-30 | Discharge: 2014-08-30 | Disposition: A | Discharge status: Home / Self Care | Payer: Medicaid Other | Source: Ambulatory Visit | Attending: Obstetrics and Gynecology | Admitting: Obstetrics and Gynecology

## 2014-08-30 ENCOUNTER — Encounter: Payer: Self-pay | Admitting: Obstetrics and Gynecology

## 2014-08-30 DIAGNOSIS — Z30430 Encounter for insertion of intrauterine contraceptive device: Secondary | ICD-10-CM | POA: Diagnosis not present

## 2014-08-30 DIAGNOSIS — Z01419 Encounter for gynecological examination (general) (routine) without abnormal findings: Secondary | ICD-10-CM | POA: Insufficient documentation

## 2014-08-30 DIAGNOSIS — Z01812 Encounter for preprocedural laboratory examination: Secondary | ICD-10-CM

## 2014-08-30 LAB — POCT URINE PREGNANCY: Preg Test, Ur: NEGATIVE

## 2014-08-30 NOTE — Patient Instructions (Signed)
Levonorgestrel intrauterine device (IUD) What is this medicine? LEVONORGESTREL IUD (LEE voe nor jes trel) is a contraceptive (birth control) device. The device is placed inside the uterus by a healthcare professional. It is used to prevent pregnancy and can also be used to treat heavy bleeding that occurs during your period. Depending on the device, it can be used for 3 to 5 years. This medicine may be used for other purposes; ask your health care provider or pharmacist if you have questions. COMMON BRAND NAME(S): LILETTA, Mirena, Skyla What should I tell my health care provider before I take this medicine? They need to know if you have any of these conditions: -abnormal Pap smear -cancer of the breast, uterus, or cervix -diabetes -endometritis -genital or pelvic infection now or in the past -have more than one sexual partner or your partner has more than one partner -heart disease -history of an ectopic or tubal pregnancy -immune system problems -IUD in place -liver disease or tumor -problems with blood clots or take blood-thinners -use intravenous drugs -uterus of unusual shape -vaginal bleeding that has not been explained -an unusual or allergic reaction to levonorgestrel, other hormones, silicone, or polyethylene, medicines, foods, dyes, or preservatives -pregnant or trying to get pregnant -breast-feeding How should I use this medicine? This device is placed inside the uterus by a health care professional. Talk to your pediatrician regarding the use of this medicine in children. Special care may be needed. Overdosage: If you think you have taken too much of this medicine contact a poison control center or emergency room at once. NOTE: This medicine is only for you. Do not share this medicine with others. What if I miss a dose? This does not apply. What may interact with this medicine? Do not take this medicine with any of the following  medications: -amprenavir -bosentan -fosamprenavir This medicine may also interact with the following medications: -aprepitant -barbiturate medicines for inducing sleep or treating seizures -bexarotene -griseofulvin -medicines to treat seizures like carbamazepine, ethotoin, felbamate, oxcarbazepine, phenytoin, topiramate -modafinil -pioglitazone -rifabutin -rifampin -rifapentine -some medicines to treat HIV infection like atazanavir, indinavir, lopinavir, nelfinavir, tipranavir, ritonavir -St. John's wort -warfarin This list may not describe all possible interactions. Give your health care provider a list of all the medicines, herbs, non-prescription drugs, or dietary supplements you use. Also tell them if you smoke, drink alcohol, or use illegal drugs. Some items may interact with your medicine. What should I watch for while using this medicine? Visit your doctor or health care professional for regular check ups. See your doctor if you or your partner has sexual contact with others, becomes HIV positive, or gets a sexual transmitted disease. This product does not protect you against HIV infection (AIDS) or other sexually transmitted diseases. You can check the placement of the IUD yourself by reaching up to the top of your vagina with clean fingers to feel the threads. Do not pull on the threads. It is a good habit to check placement after each menstrual period. Call your doctor right away if you feel more of the IUD than just the threads or if you cannot feel the threads at all. The IUD may come out by itself. You may become pregnant if the device comes out. If you notice that the IUD has come out use a backup birth control method like condoms and call your health care provider. Using tampons will not change the position of the IUD and are okay to use during your period. What side effects may   I notice from receiving this medicine? Side effects that you should report to your doctor or  health care professional as soon as possible: -allergic reactions like skin rash, itching or hives, swelling of the face, lips, or tongue -fever, flu-like symptoms -genital sores -high blood pressure -no menstrual period for 6 weeks during use -pain, swelling, warmth in the leg -pelvic pain or tenderness -severe or sudden headache -signs of pregnancy -stomach cramping -sudden shortness of breath -trouble with balance, talking, or walking -unusual vaginal bleeding, discharge -yellowing of the eyes or skin Side effects that usually do not require medical attention (report to your doctor or health care professional if they continue or are bothersome): -acne -breast pain -change in sex drive or performance -changes in weight -cramping, dizziness, or faintness while the device is being inserted -headache -irregular menstrual bleeding within first 3 to 6 months of use -nausea This list may not describe all possible side effects. Call your doctor for medical advice about side effects. You may report side effects to FDA at 1-800-FDA-1088. Where should I keep my medicine? This does not apply. NOTE: This sheet is a summary. It may not cover all possible information. If you have questions about this medicine, talk to your doctor, pharmacist, or health care provider.  2015, Elsevier/Gold Standard. (2011-09-25 13:54:04)  

## 2014-08-30 NOTE — Progress Notes (Signed)
  Subjective:     Sarah Blevins is a 33 y.o. female who presents for a postpartum visit. She is 10 weeks postpartum following a low cervical transverse Cesarean section. I have fully reviewed the prenatal and intrapartum course. The delivery was at 39 gestational weeks. Outcome: primary cesarean section, low transverse incision. Anesthesia: spinal. Postpartum course has been uncomplicated. Baby's course has been uncomplicated. Baby is feeding by breast. Bleeding staining only. Bowel function is normal. Bladder function is normal. Patient is not sexually active. Contraception method is none. Postpartum depression screening: negative. Although her score is 17, questions were reviewed with the patient who seems to have a cognitive impairment. She is quite happy with her newborn and the dynamics of her family. She has already returned to work. She denies suicidal or homicidal ideations. Questions were also reviewed in the presence of her husband who agrees that it doesn't appear that she understood the questions appropriately.     Review of Systems A comprehensive review of systems was negative.   Objective:    BP 113/72 mmHg  Pulse 83  Ht 5\' 3"  (1.6 m)  Wt 124 lb (56.246 kg)  BMI 21.97 kg/m2  Breastfeeding? Yes  General:  alert, cooperative and no distress   Breasts:  inspection negative, no nipple discharge or bleeding, no masses or nodularity palpable  Lungs: clear to auscultation bilaterally  Heart:  regular rate and rhythm  Abdomen: soft, non-tender; bowel sounds normal; no masses,  no organomegaly and incision: healed well   Vulva:  normal  Vagina: normal vagina, no discharge, exudate, lesion, or erythema  Cervix:  no cervical motion tenderness and no lesions  Corpus: normal size, contour, position, consistency, mobility, non-tender  Adnexa:  normal adnexa and no mass, fullness, tenderness  Rectal Exam: Not performed.        Assessment:     Normal postpartum exam. Pap smear done  at today's visit.   Plan:    1. Contraception: IUD  IUD Procedure Note Patient identified, informed consent performed, signed copy in chart, time out was performed.  Urine pregnancy test negative.  Speculum placed in the vagina.  Cervix visualized.  Cleaned with Betadine x 2.  Grasped anteriorly with a single tooth tenaculum.  Uterus sounded to 8 cm.  Mirena IUD placed per manufacturer's recommendations.  Strings trimmed to 3 cm. Tenaculum was removed, good hemostasis noted.  Patient tolerated procedure well.   Patient given post procedure instructions and Mirena care card with expiration date.  Patient is asked to check IUD strings periodically   2. Repeat pap smear today secondary to ASCUS pap 3. Follow up in: 4 weeks or as needed.

## 2014-08-31 LAB — CYTOLOGY - PAP

## 2014-09-27 ENCOUNTER — Ambulatory Visit: Payer: Medicaid Other | Admitting: Obstetrics & Gynecology

## 2014-10-03 ENCOUNTER — Ambulatory Visit (INDEPENDENT_AMBULATORY_CARE_PROVIDER_SITE_OTHER): Payer: Self-pay | Admitting: Obstetrics and Gynecology

## 2014-10-03 ENCOUNTER — Encounter: Payer: Self-pay | Admitting: Obstetrics and Gynecology

## 2014-10-03 VITALS — BP 110/66 | HR 61 | Wt 120.0 lb

## 2014-10-03 DIAGNOSIS — Z30432 Encounter for removal of intrauterine contraceptive device: Secondary | ICD-10-CM

## 2014-10-03 NOTE — Progress Notes (Signed)
Patient ID: Sarah Blevins, female   DOB: 09/03/1981, 34 y.o.   MRN: [id number] 34 yo G2P2 presenting today for IUD removal. Patient had IUD placed on 12/23 and reports increased irritability, mood swings and insomnia since. We discussed that her social situation has changed- she works 3rd shift while her husband works during the day. She has a newborn and has to care for 2 young children during the day as well. Patient states that she was able to sleep before and attributes her symptoms to the IUD.  IUD Procedure Note Patient identified, informed consent performed, signed copy in chart, time out was performed.  Urine pregnancy test negative.  Speculum placed in the vagina.  Cervix visualized with strings extending at the external os. The strings were grasped with a ring forceps and the IUD was removed without difficulty. Patient tolerated procedure well.   Patient plans to use condoms for now  Advise to use Benadryl to help with sleep. I feel uncomfortable prescribing Ambien given that she plans on using it during the day while caring for her 2 small children  RTC prn for contraception

## 2014-10-19 IMAGING — US US OB LIMITED
1 series · 10 of 10 positions shown · non-contrast
Comparison: none

[Series 1: us ob limited · 10 of 10 slices shown]
[im 1/10]
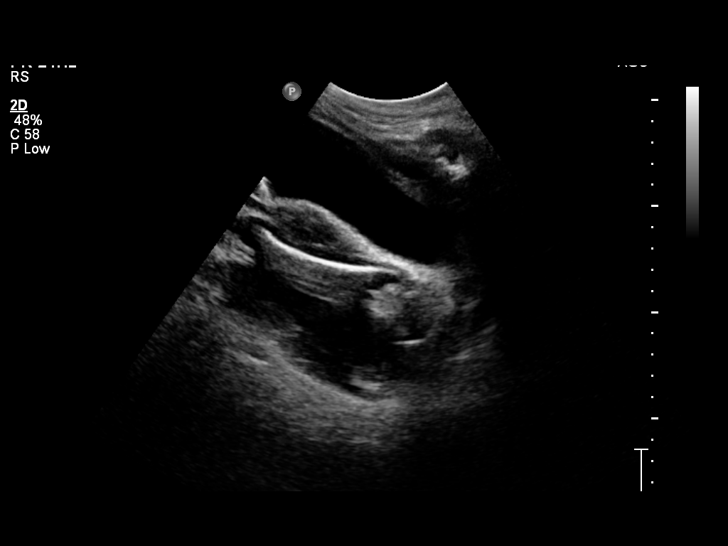
[im 2/10]
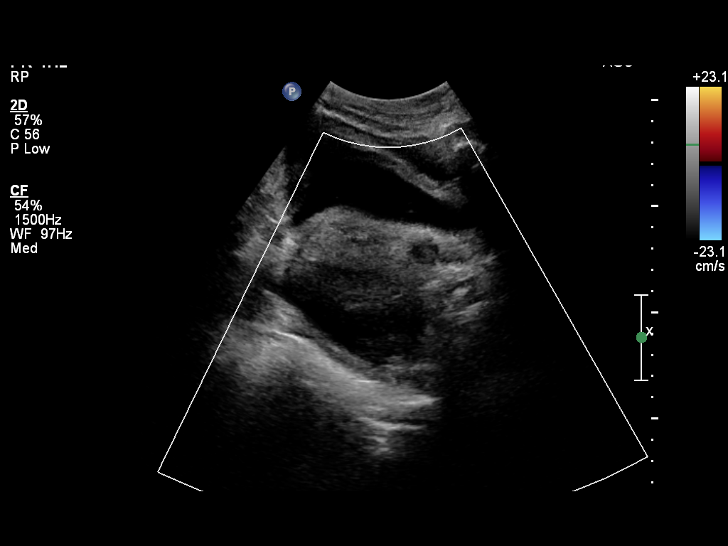
[im 3/10]
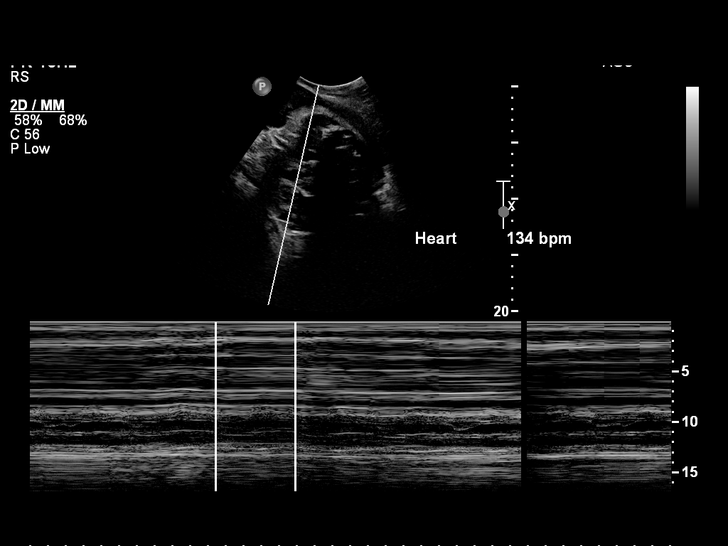
[im 4/10]
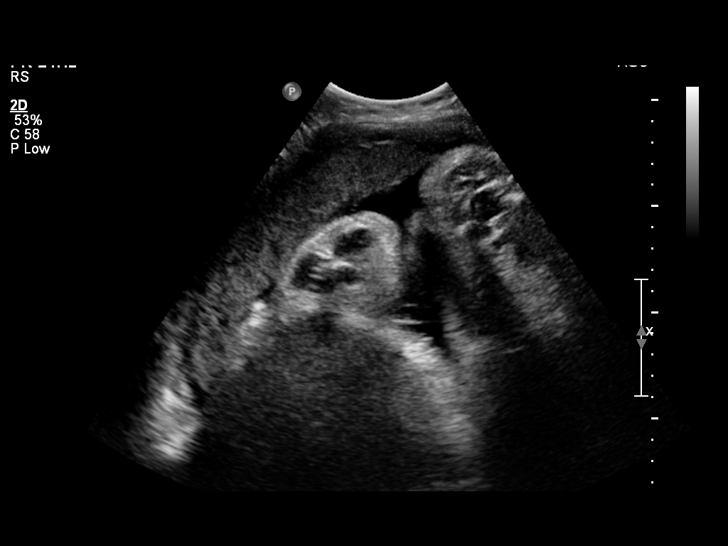
[im 5/10]
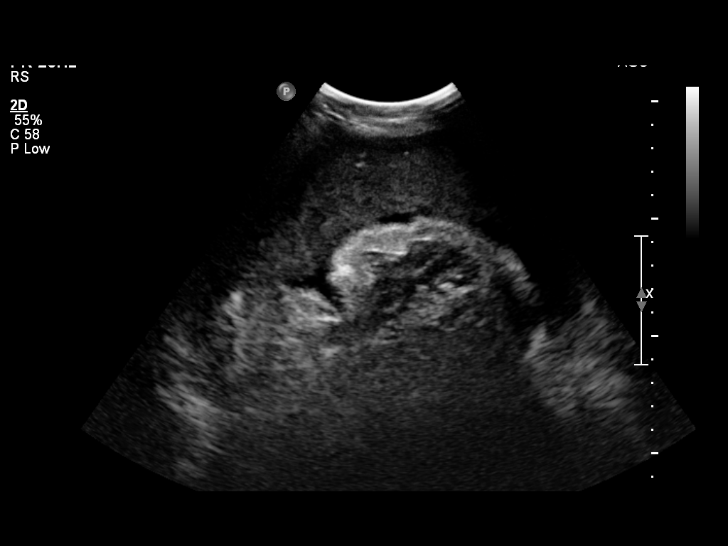
[im 6/10]
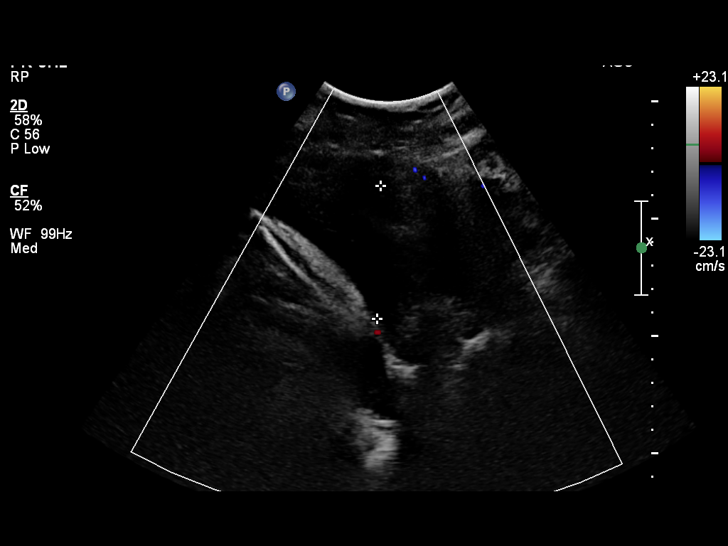
[im 7/10]
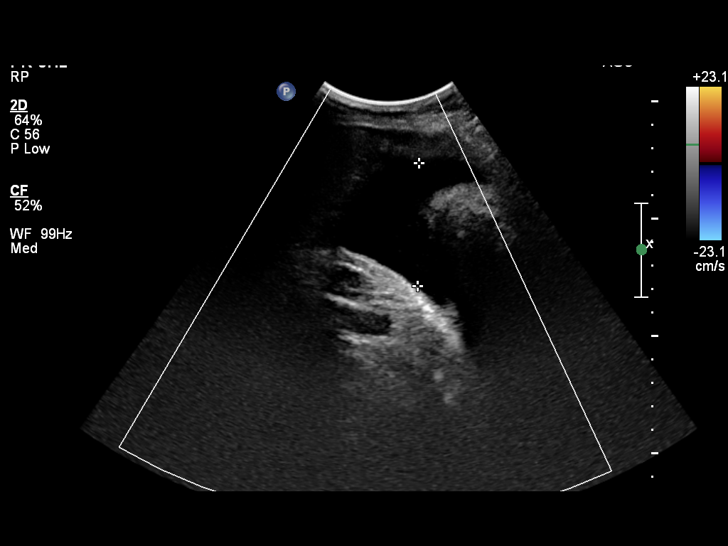
[im 8/10]
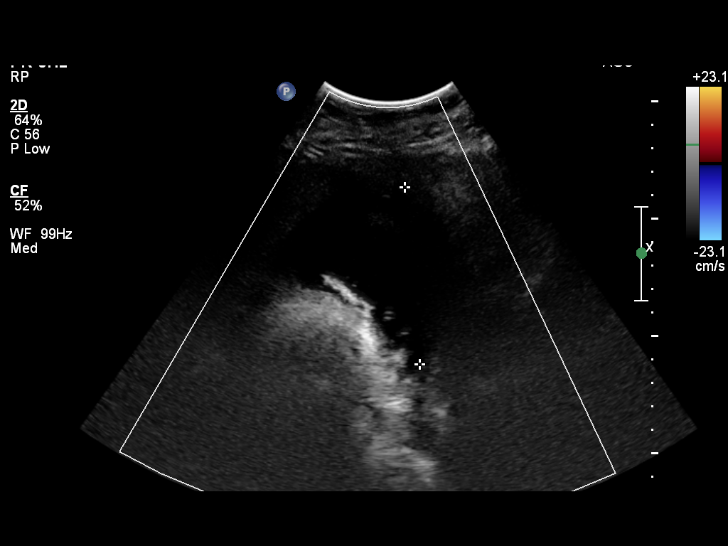
[im 9/10]
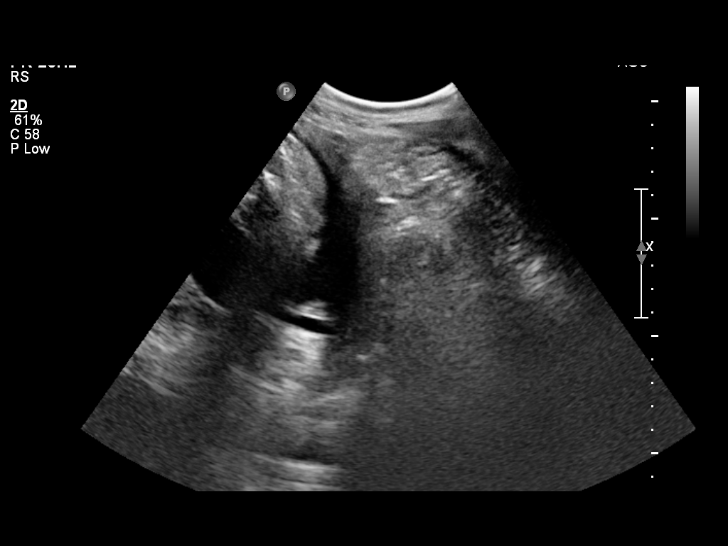
[im 10/10]
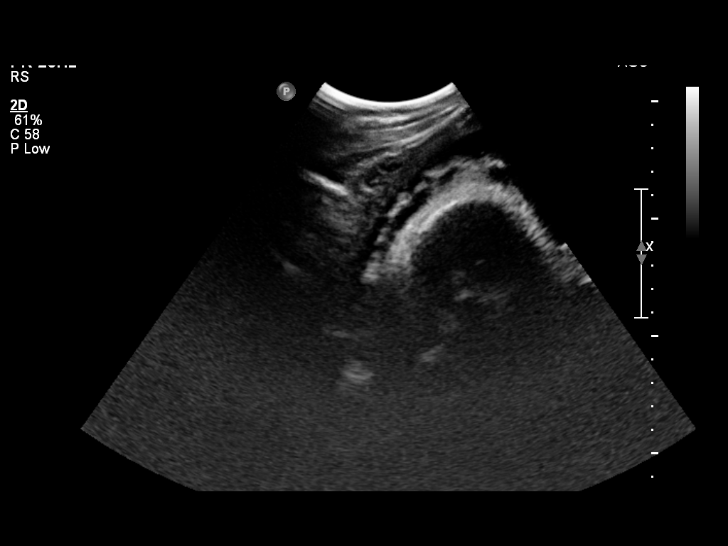

[10 of 10 positions shown; findings below may reference images not displayed]

OBSTETRICS REPORT
                      (Signed Final 06/15/2014 [DATE])

Service(s) Provided

 [HOSPITAL]                                         76815.0
Indications

 Advanced cervical dilatation (8cm)
 Determine fetal presentation using ultrasound         Z36
 39 weeks gestation of pregnancy
Fetal Evaluation

 Num Of Fetuses:    1
 Fetal Heart Rate:  134                          bpm
 Cardiac Activity:  Observed
 Presentation:      Breech, footling
 Placenta:          Anterior, above cervical os
 P. Cord            Previously Visualized
 Insertion:

 Amniotic Fluid
 AFI FV:      Subjectively within normal limits
 AFI Sum:     18.43   cm       76  %Tile     Larg Pckt:    7.55  cm
 RUQ:   5.65    cm   RLQ:    7.55   cm    LUQ:   5.23    cm
Gestational Age

 LMP:           39w 0d        Date:  09/15/13                 EDD:   06/22/14
 Best:          39w 0d     Det. By:  LMP  (09/15/13)          EDD:   06/22/14
Cervix Uterus Adnexa

 Cervix:       Not visualized (advanced GA >92wks)
 Uterus:       No abnormality visualized.
 Cul De Sac:   No free fluid seen.
 Left Ovary:    Not visualized.
 Right Ovary:   Not visualized.

 Adnexa:     No abnormality visualized.
Impression

 SIUP at 71w2d with 8cm cervical dilation/labor for
 presentation (remote read)
 footling breech
 no previa
Recommendations

 Management as per OB provider.

## 2016-05-02 ENCOUNTER — Ambulatory Visit (INDEPENDENT_AMBULATORY_CARE_PROVIDER_SITE_OTHER): Payer: Managed Care, Other (non HMO) | Admitting: Family Medicine

## 2016-05-02 ENCOUNTER — Encounter: Payer: Self-pay | Admitting: Family Medicine

## 2016-05-02 VITALS — BP 112/76 | HR 74 | Ht 63.0 in | Wt 117.0 lb

## 2016-05-02 DIAGNOSIS — Z01419 Encounter for gynecological examination (general) (routine) without abnormal findings: Secondary | ICD-10-CM

## 2016-05-02 DIAGNOSIS — Z124 Encounter for screening for malignant neoplasm of cervix: Secondary | ICD-10-CM | POA: Diagnosis not present

## 2016-05-02 DIAGNOSIS — Z1151 Encounter for screening for human papillomavirus (HPV): Secondary | ICD-10-CM | POA: Diagnosis not present

## 2016-05-02 LAB — CBC
HCT: 40.6 % (ref 35.0–45.0)
HEMOGLOBIN: 13.6 g/dL (ref 11.7–15.5)
MCH: 29.2 pg (ref 27.0–33.0)
MCHC: 33.5 g/dL (ref 32.0–36.0)
MCV: 87.3 fL (ref 80.0–100.0)
MPV: 10.5 fL (ref 7.5–12.5)
Platelets: 269 10*3/uL (ref 140–400)
RBC: 4.65 MIL/uL (ref 3.80–5.10)
RDW: 12.7 % (ref 11.0–15.0)
WBC: 7.9 10*3/uL (ref 3.8–10.8)

## 2016-05-02 LAB — LIPID PANEL
CHOLESTEROL: 140 mg/dL (ref 125–200)
HDL: 56 mg/dL (ref >=46)
LDL Cholesterol: 57 mg/dL (ref <130)
Total CHOL/HDL Ratio: 2.5 Ratio (ref <=5.0)
Triglycerides: 134 mg/dL (ref <150)
VLDL: 27 mg/dL (ref <30)

## 2016-05-02 LAB — COMPREHENSIVE METABOLIC PANEL
ALBUMIN: 4.5 g/dL (ref 3.6–5.1)
ALK PHOS: 49 U/L (ref 33–115)
ALT: 11 U/L (ref 6–29)
AST: 16 U/L (ref 10–30)
BILIRUBIN TOTAL: 0.6 mg/dL (ref 0.2–1.2)
BUN: 14 mg/dL (ref 7–25)
CO2: 22 mmol/L (ref 20–31)
CREATININE: 0.68 mg/dL (ref 0.50–1.10)
Calcium: 9.2 mg/dL (ref 8.6–10.2)
Chloride: 105 mmol/L (ref 98–110)
Glucose, Bld: 63 mg/dL — ABNORMAL LOW (ref 65–99)
Potassium: 3.8 mmol/L (ref 3.5–5.3)
SODIUM: 139 mmol/L (ref 135–146)
TOTAL PROTEIN: 7.5 g/dL (ref 6.1–8.1)

## 2016-05-02 LAB — TSH: TSH: 2.11 m[IU]/L

## 2016-05-02 NOTE — Patient Instructions (Signed)
Preventive Care for Adults, Female A healthy lifestyle and preventive care can promote health and wellness. Preventive health guidelines for women include the following key practices.  A routine yearly physical is a good way to check with your health care provider about your health and preventive screening. It is a chance to share any concerns and updates on your health and to receive a thorough exam.  Visit your dentist for a routine exam and preventive care every 6 months. Brush your teeth twice a day and floss once a day. Good oral hygiene prevents tooth decay and gum disease.  The frequency of eye exams is based on your age, health, family medical history, use of contact lenses, and other factors. Follow your health care provider's recommendations for frequency of eye exams.  Eat a healthy diet. Foods like vegetables, fruits, whole grains, low-fat dairy products, and lean protein foods contain the nutrients you need without too many calories. Decrease your intake of foods high in solid fats, added sugars, and salt. Eat the right amount of calories for you.Get information about a proper diet from your health care provider, if necessary.  Regular physical exercise is one of the most important things you can do for your health. Most adults should get at least 150 minutes of moderate-intensity exercise (any activity that increases your heart rate and causes you to sweat) each week. In addition, most adults need muscle-strengthening exercises on 2 or more days a week.  Maintain a healthy weight. The body mass index (BMI) is a screening tool to identify possible weight problems. It provides an estimate of body fat based on height and weight. Your health care provider can find your BMI and can help you achieve or maintain a healthy weight.For adults 20 years and older:  A BMI below 18.5 is considered underweight.  A BMI of 18.5 to 24.9 is normal.  A BMI of 25 to 29.9 is considered overweight.  A  BMI of 30 and above is considered obese.  Maintain normal blood lipids and cholesterol levels by exercising and minimizing your intake of saturated fat. Eat a balanced diet with plenty of fruit and vegetables. Blood tests for lipids and cholesterol should begin at age 45 and be repeated every 5 years. If your lipid or cholesterol levels are high, you are over 50, or you are at high risk for heart disease, you may need your cholesterol levels checked more frequently.Ongoing high lipid and cholesterol levels should be treated with medicines if diet and exercise are not working.  If you smoke, find out from your health care provider how to quit. If you do not use tobacco, do not start.  Lung cancer screening is recommended for adults aged 45-80 years who are at high risk for developing lung cancer because of a history of smoking. A yearly low-dose CT scan of the lungs is recommended for people who have at least a 30-pack-year history of smoking and are a current smoker or have quit within the past 15 years. A pack year of smoking is smoking an average of 1 pack of cigarettes a day for 1 year (for example: 1 pack a day for 30 years or 2 packs a day for 15 years). Yearly screening should continue until the smoker has stopped smoking for at least 15 years. Yearly screening should be stopped for people who develop a health problem that would prevent them from having lung cancer treatment.  If you are pregnant, do not drink alcohol. If you are  breastfeeding, be very cautious about drinking alcohol. If you are not pregnant and choose to drink alcohol, do not have more than 1 drink per day. One drink is considered to be 12 ounces (355 mL) of beer, 5 ounces (148 mL) of wine, or 1.5 ounces (44 mL) of liquor.  Avoid use of street drugs. Do not share needles with anyone. Ask for help if you need support or instructions about stopping the use of drugs.  High blood pressure causes heart disease and increases the risk  of stroke. Your blood pressure should be checked at least every 1 to 2 years. Ongoing high blood pressure should be treated with medicines if weight loss and exercise do not work.  If you are 55-79 years old, ask your health care provider if you should take aspirin to prevent strokes.  Diabetes screening is done by taking a blood sample to check your blood glucose level after you have not eaten for a certain period of time (fasting). If you are not overweight and you do not have risk factors for diabetes, you should be screened once every 3 years starting at age 45. If you are overweight or obese and you are 40-70 years of age, you should be screened for diabetes every year as part of your cardiovascular risk assessment.  Breast cancer screening is essential preventive care for women. You should practice "breast self-awareness." This means understanding the normal appearance and feel of your breasts and may include breast self-examination. Any changes detected, no matter how small, should be reported to a health care provider. Women in their 20s and 30s should have a clinical breast exam (CBE) by a health care provider as part of a regular health exam every 1 to 3 years. After age 40, women should have a CBE every year. Starting at age 40, women should consider having a mammogram (breast X-ray test) every year. Women who have a family history of breast cancer should talk to their health care provider about genetic screening. Women at a high risk of breast cancer should talk to their health care providers about having an MRI and a mammogram every year.  Breast cancer gene (BRCA)-related cancer risk assessment is recommended for women who have family members with BRCA-related cancers. BRCA-related cancers include breast, ovarian, tubal, and peritoneal cancers. Having family members with these cancers may be associated with an increased risk for harmful changes (mutations) in the breast cancer genes BRCA1 and  BRCA2. Results of the assessment will determine the need for genetic counseling and BRCA1 and BRCA2 testing.  Your health care provider may recommend that you be screened regularly for cancer of the pelvic organs (ovaries, uterus, and vagina). This screening involves a pelvic examination, including checking for microscopic changes to the surface of your cervix (Pap test). You may be encouraged to have this screening done every 3 years, beginning at age 21.  For women ages 30-65, health care providers may recommend pelvic exams and Pap testing every 3 years, or they may recommend the Pap and pelvic exam, combined with testing for human papilloma virus (HPV), every 5 years. Some types of HPV increase your risk of cervical cancer. Testing for HPV may also be done on women of any age with unclear Pap test results.  Other health care providers may not recommend any screening for nonpregnant women who are considered low risk for pelvic cancer and who do not have symptoms. Ask your health care provider if a screening pelvic exam is right for   you.  If you have had past treatment for cervical cancer or a condition that could lead to cancer, you need Pap tests and screening for cancer for at least 20 years after your treatment. If Pap tests have been discontinued, your risk factors (such as having a new sexual partner) need to be reassessed to determine if screening should resume. Some women have medical problems that increase the chance of getting cervical cancer. In these cases, your health care provider may recommend more frequent screening and Pap tests.  Colorectal cancer can be detected and often prevented. Most routine colorectal cancer screening begins at the age of 50 years and continues through age 75 years. However, your health care provider may recommend screening at an earlier age if you have risk factors for colon cancer. On a yearly basis, your health care provider may provide home test kits to check  for hidden blood in the stool. Use of a small camera at the end of a tube, to directly examine the colon (sigmoidoscopy or colonoscopy), can detect the earliest forms of colorectal cancer. Talk to your health care provider about this at age 50, when routine screening begins. Direct exam of the colon should be repeated every 5-10 years through age 75 years, unless early forms of precancerous polyps or small growths are found.  People who are at an increased risk for hepatitis B should be screened for this virus. You are considered at high risk for hepatitis B if:  You were born in a country where hepatitis B occurs often. Talk with your health care provider about which countries are considered high risk.  Your parents were born in a high-risk country and you have not received a shot to protect against hepatitis B (hepatitis B vaccine).  You have HIV or AIDS.  You use needles to inject street drugs.  You live with, or have sex with, someone who has hepatitis B.  You get hemodialysis treatment.  You take certain medicines for conditions like cancer, organ transplantation, and autoimmune conditions.  Hepatitis C blood testing is recommended for all people born from 1945 through 1965 and any individual with known risks for hepatitis C.  Practice safe sex. Use condoms and avoid high-risk sexual practices to reduce the spread of sexually transmitted infections (STIs). STIs include gonorrhea, chlamydia, syphilis, trichomonas, herpes, HPV, and human immunodeficiency virus (HIV). Herpes, HIV, and HPV are viral illnesses that have no cure. They can result in disability, cancer, and death.  You should be screened for sexually transmitted illnesses (STIs) including gonorrhea and chlamydia if:  You are sexually active and are younger than 24 years.  You are older than 24 years and your health care provider tells you that you are at risk for this type of infection.  Your sexual activity has changed  since you were last screened and you are at an increased risk for chlamydia or gonorrhea. Ask your health care provider if you are at risk.  If you are at risk of being infected with HIV, it is recommended that you take a prescription medicine daily to prevent HIV infection. This is called preexposure prophylaxis (PrEP). You are considered at risk if:  You are sexually active and do not regularly use condoms or know the HIV status of your partner(s).  You take drugs by injection.  You are sexually active with a partner who has HIV.  Talk with your health care provider about whether you are at high risk of being infected with HIV. If   you choose to begin PrEP, you should first be tested for HIV. You should then be tested every 3 months for as long as you are taking PrEP.  Osteoporosis is a disease in which the bones lose minerals and strength with aging. This can result in serious bone fractures or breaks. The risk of osteoporosis can be identified using a bone density scan. Women ages 67 years and over and women at risk for fractures or osteoporosis should discuss screening with their health care providers. Ask your health care provider whether you should take a calcium supplement or vitamin D to reduce the rate of osteoporosis.  Menopause can be associated with physical symptoms and risks. Hormone replacement therapy is available to decrease symptoms and risks. You should talk to your health care provider about whether hormone replacement therapy is right for you.  Use sunscreen. Apply sunscreen liberally and repeatedly throughout the day. You should seek shade when your shadow is shorter than you. Protect yourself by wearing long sleeves, pants, a wide-brimmed hat, and sunglasses year round, whenever you are outdoors.  Once a month, do a whole body skin exam, using a mirror to look at the skin on your back. Tell your health care provider of new moles, moles that have irregular borders, moles that  are larger than a pencil eraser, or moles that have changed in shape or color.  Stay current with required vaccines (immunizations).  Influenza vaccine. All adults should be immunized every year.  Tetanus, diphtheria, and acellular pertussis (Td, Tdap) vaccine. Pregnant women should receive 1 dose of Tdap vaccine during each pregnancy. The dose should be obtained regardless of the length of time since the last dose. Immunization is preferred during the 27th-36th week of gestation. An adult who has not previously received Tdap or who does not know her vaccine status should receive 1 dose of Tdap. This initial dose should be followed by tetanus and diphtheria toxoids (Td) booster doses every 10 years. Adults with an unknown or incomplete history of completing a 3-dose immunization series with Td-containing vaccines should begin or complete a primary immunization series including a Tdap dose. Adults should receive a Td booster every 10 years.  Varicella vaccine. An adult without evidence of immunity to varicella should receive 2 doses or a second dose if she has previously received 1 dose. Pregnant females who do not have evidence of immunity should receive the first dose after pregnancy. This first dose should be obtained before leaving the health care facility. The second dose should be obtained 4-8 weeks after the first dose.  Human papillomavirus (HPV) vaccine. Females aged 13-26 years who have not received the vaccine previously should obtain the 3-dose series. The vaccine is not recommended for use in pregnant females. However, pregnancy testing is not needed before receiving a dose. If a female is found to be pregnant after receiving a dose, no treatment is needed. In that case, the remaining doses should be delayed until after the pregnancy. Immunization is recommended for any person with an immunocompromised condition through the age of 61 years if she did not get any or all doses earlier. During the  3-dose series, the second dose should be obtained 4-8 weeks after the first dose. The third dose should be obtained 24 weeks after the first dose and 16 weeks after the second dose.  Zoster vaccine. One dose is recommended for adults aged 30 years or older unless certain conditions are present.  Measles, mumps, and rubella (MMR) vaccine. Adults born  before 1957 generally are considered immune to measles and mumps. Adults born in 1957 or later should have 1 or more doses of MMR vaccine unless there is a contraindication to the vaccine or there is laboratory evidence of immunity to each of the three diseases. A routine second dose of MMR vaccine should be obtained at least 28 days after the first dose for students attending postsecondary schools, health care workers, or international travelers. People who received inactivated measles vaccine or an unknown type of measles vaccine during 1963-1967 should receive 2 doses of MMR vaccine. People who received inactivated mumps vaccine or an unknown type of mumps vaccine before 1979 and are at high risk for mumps infection should consider immunization with 2 doses of MMR vaccine. For females of childbearing age, rubella immunity should be determined. If there is no evidence of immunity, females who are not pregnant should be vaccinated. If there is no evidence of immunity, females who are pregnant should delay immunization until after pregnancy. Unvaccinated health care workers born before 1957 who lack laboratory evidence of measles, mumps, or rubella immunity or laboratory confirmation of disease should consider measles and mumps immunization with 2 doses of MMR vaccine or rubella immunization with 1 dose of MMR vaccine.  Pneumococcal 13-valent conjugate (PCV13) vaccine. When indicated, a person who is uncertain of his immunization history and has no record of immunization should receive the PCV13 vaccine. All adults 65 years of age and older should receive this  vaccine. An adult aged 19 years or older who has certain medical conditions and has not been previously immunized should receive 1 dose of PCV13 vaccine. This PCV13 should be followed with a dose of pneumococcal polysaccharide (PPSV23) vaccine. Adults who are at high risk for pneumococcal disease should obtain the PPSV23 vaccine at least 8 weeks after the dose of PCV13 vaccine. Adults older than 35 years of age who have normal immune system function should obtain the PPSV23 vaccine dose at least 1 year after the dose of PCV13 vaccine.  Pneumococcal polysaccharide (PPSV23) vaccine. When PCV13 is also indicated, PCV13 should be obtained first. All adults aged 65 years and older should be immunized. An adult younger than age 65 years who has certain medical conditions should be immunized. Any person who resides in a nursing home or long-term care facility should be immunized. An adult smoker should be immunized. People with an immunocompromised condition and certain other conditions should receive both PCV13 and PPSV23 vaccines. People with human immunodeficiency virus (HIV) infection should be immunized as soon as possible after diagnosis. Immunization during chemotherapy or radiation therapy should be avoided. Routine use of PPSV23 vaccine is not recommended for American Indians, [place] Natives, or people younger than 65 years unless there are medical conditions that require PPSV23 vaccine. When indicated, people who have unknown immunization and have no record of immunization should receive PPSV23 vaccine. One-time revaccination 5 years after the first dose of PPSV23 is recommended for people aged 19-64 years who have chronic kidney failure, nephrotic syndrome, asplenia, or immunocompromised conditions. People who received 1-2 doses of PPSV23 before age 65 years should receive another dose of PPSV23 vaccine at age 65 years or later if at least 5 years have passed since the previous dose. Doses of PPSV23 are not  needed for people immunized with PPSV23 at or after age 65 years.  Meningococcal vaccine. Adults with asplenia or persistent complement component deficiencies should receive 2 doses of quadrivalent meningococcal conjugate (MenACWY-D) vaccine. The doses should be obtained   at least 2 months apart. Microbiologists working with certain meningococcal bacteria, [place] recruits, people at risk during an outbreak, and people who travel to or live in countries with a high rate of meningitis should be immunized. A first-year college student up through age 34 years who is living in a residence hall should receive a dose if she did not receive a dose on or after her 16th birthday. Adults who have certain high-risk conditions should receive one or more doses of vaccine.  Hepatitis A vaccine. Adults who wish to be protected from this disease, have certain high-risk conditions, work with hepatitis A-infected animals, work in hepatitis A research labs, or travel to or work in countries with a high rate of hepatitis A should be immunized. Adults who were previously unvaccinated and who anticipate close contact with an international adoptee during the first 60 days after arrival in the [place] from a country with a high rate of hepatitis A should be immunized.  Hepatitis B vaccine. Adults who wish to be protected from this disease, have certain high-risk conditions, may be exposed to blood or other infectious body fluids, are household contacts or sex partners of hepatitis B positive people, are clients or workers in certain care facilities, or travel to or work in countries with a high rate of hepatitis B should be immunized.  Haemophilus influenzae type b (Hib) vaccine. A previously unvaccinated person with asplenia or sickle cell disease or having a scheduled splenectomy should receive 1 dose of Hib vaccine. Regardless of previous immunization, a recipient of a hematopoietic stem cell transplant should receive a  3-dose series 6-12 months after her successful transplant. Hib vaccine is not recommended for adults with HIV infection. Preventive Services / Frequency Ages 35 to 4 years  Blood pressure check.** / Every 3-5 years.  Lipid and cholesterol check.** / Every 5 years beginning at age 60.  Clinical breast exam.** / Every 3 years for women in their 71s and 10s.  BRCA-related cancer risk assessment.** / For women who have family members with a BRCA-related cancer (breast, ovarian, tubal, or peritoneal cancers).  Pap test.** / Every 2 years from ages 76 through 26. Every 3 years starting at age 61 through age 76 or 93 with a history of 3 consecutive normal Pap tests.  HPV screening.** / Every 3 years from ages 37 through ages 60 to 51 with a history of 3 consecutive normal Pap tests.  Hepatitis C blood test.** / For any individual with known risks for hepatitis C.  Skin self-exam. / Monthly.  Influenza vaccine. / Every year.  Tetanus, diphtheria, and acellular pertussis (Tdap, Td) vaccine.** / Consult your health care provider. Pregnant women should receive 1 dose of Tdap vaccine during each pregnancy. 1 dose of Td every 10 years.  Varicella vaccine.** / Consult your health care provider. Pregnant females who do not have evidence of immunity should receive the first dose after pregnancy.  HPV vaccine. / 3 doses over 6 months, if 93 and younger. The vaccine is not recommended for use in pregnant females. However, pregnancy testing is not needed before receiving a dose.  Measles, mumps, rubella (MMR) vaccine.** / You need at least 1 dose of MMR if you were born in 1957 or later. You may also need a 2nd dose. For females of childbearing age, rubella immunity should be determined. If there is no evidence of immunity, females who are not pregnant should be vaccinated. If there is no evidence of immunity, females who are  pregnant should delay immunization until after pregnancy.  Pneumococcal  13-valent conjugate (PCV13) vaccine.** / Consult your health care provider.  Pneumococcal polysaccharide (PPSV23) vaccine.** / 1 to 2 doses if you smoke cigarettes or if you have certain conditions.  Meningococcal vaccine.** / 1 dose if you are age 68 to 8 years and a Market researcher living in a residence hall, or have one of several medical conditions, you need to get vaccinated against meningococcal disease. You may also need additional booster doses.  Hepatitis A vaccine.** / Consult your health care provider.  Hepatitis B vaccine.** / Consult your health care provider.  Haemophilus influenzae type b (Hib) vaccine.** / Consult your health care provider. Ages 7 to 53 years  Blood pressure check.** / Every year.  Lipid and cholesterol check.** / Every 5 years beginning at age 25 years.  Lung cancer screening. / Every year if you are aged 11-80 years and have a 30-pack-year history of smoking and currently smoke or have quit within the past 15 years. Yearly screening is stopped once you have quit smoking for at least 15 years or develop a health problem that would prevent you from having lung cancer treatment.  Clinical breast exam.** / Every year after age 48 years.  BRCA-related cancer risk assessment.** / For women who have family members with a BRCA-related cancer (breast, ovarian, tubal, or peritoneal cancers).  Mammogram.** / Every year beginning at age 41 years and continuing for as long as you are in good health. Consult with your health care provider.  Pap test.** / Every 3 years starting at age 65 years through age 37 or 70 years with a history of 3 consecutive normal Pap tests.  HPV screening.** / Every 3 years from ages 72 years through ages 60 to 40 years with a history of 3 consecutive normal Pap tests.  Fecal occult blood test (FOBT) of stool. / Every year beginning at age 21 years and continuing until age 5 years. You may not need to do this test if you get  a colonoscopy every 10 years.  Flexible sigmoidoscopy or colonoscopy.** / Every 5 years for a flexible sigmoidoscopy or every 10 years for a colonoscopy beginning at age 35 years and continuing until age 48 years.  Hepatitis C blood test.** / For all people born from [birth year] through 1965 and any individual with known risks for hepatitis C.  Skin self-exam. / Monthly.  Influenza vaccine. / Every year.  Tetanus, diphtheria, and acellular pertussis (Tdap/Td) vaccine.** / Consult your health care provider. Pregnant women should receive 1 dose of Tdap vaccine during each pregnancy. 1 dose of Td every 10 years.  Varicella vaccine.** / Consult your health care provider. Pregnant females who do not have evidence of immunity should receive the first dose after pregnancy.  Zoster vaccine.** / 1 dose for adults aged 30 years or older.  Measles, mumps, rubella (MMR) vaccine.** / You need at least 1 dose of MMR if you were born in 1957 or later. You may also need a second dose. For females of childbearing age, rubella immunity should be determined. If there is no evidence of immunity, females who are not pregnant should be vaccinated. If there is no evidence of immunity, females who are pregnant should delay immunization until after pregnancy.  Pneumococcal 13-valent conjugate (PCV13) vaccine.** / Consult your health care provider.  Pneumococcal polysaccharide (PPSV23) vaccine.** / 1 to 2 doses if you smoke cigarettes or if you have certain conditions.  Meningococcal vaccine.** /  Consult your health care provider.  Hepatitis A vaccine.** / Consult your health care provider.  Hepatitis B vaccine.** / Consult your health care provider.  Haemophilus influenzae type b (Hib) vaccine.** / Consult your health care provider. Ages 64 years and over  Blood pressure check.** / Every year.  Lipid and cholesterol check.** / Every 5 years beginning at age 23 years.  Lung cancer screening. / Every year if you  are aged 16-80 years and have a 30-pack-year history of smoking and currently smoke or have quit within the past 15 years. Yearly screening is stopped once you have quit smoking for at least 15 years or develop a health problem that would prevent you from having lung cancer treatment.  Clinical breast exam.** / Every year after age 74 years.  BRCA-related cancer risk assessment.** / For women who have family members with a BRCA-related cancer (breast, ovarian, tubal, or peritoneal cancers).  Mammogram.** / Every year beginning at age 44 years and continuing for as long as you are in good health. Consult with your health care provider.  Pap test.** / Every 3 years starting at age 58 years through age 22 or 39 years with 3 consecutive normal Pap tests. Testing can be stopped between 65 and 70 years with 3 consecutive normal Pap tests and no abnormal Pap or HPV tests in the past 10 years.  HPV screening.** / Every 3 years from ages 64 years through ages 70 or 61 years with a history of 3 consecutive normal Pap tests. Testing can be stopped between 65 and 70 years with 3 consecutive normal Pap tests and no abnormal Pap or HPV tests in the past 10 years.  Fecal occult blood test (FOBT) of stool. / Every year beginning at age 40 years and continuing until age 27 years. You may not need to do this test if you get a colonoscopy every 10 years.  Flexible sigmoidoscopy or colonoscopy.** / Every 5 years for a flexible sigmoidoscopy or every 10 years for a colonoscopy beginning at age 7 years and continuing until age 32 years.  Hepatitis C blood test.** / For all people born from [birth year] through 1965 and any individual with known risks for hepatitis C.  Osteoporosis screening.** / A one-time screening for women ages 30 years and over and women at risk for fractures or osteoporosis.  Skin self-exam. / Monthly.  Influenza vaccine. / Every year.  Tetanus, diphtheria, and acellular pertussis (Tdap/Td)  vaccine.** / 1 dose of Td every 10 years.  Varicella vaccine.** / Consult your health care provider.  Zoster vaccine.** / 1 dose for adults aged 35 years or older.  Pneumococcal 13-valent conjugate (PCV13) vaccine.** / Consult your health care provider.  Pneumococcal polysaccharide (PPSV23) vaccine.** / 1 dose for all adults aged 46 years and older.  Meningococcal vaccine.** / Consult your health care provider.  Hepatitis A vaccine.** / Consult your health care provider.  Hepatitis B vaccine.** / Consult your health care provider.  Haemophilus influenzae type b (Hib) vaccine.** / Consult your health care provider. ** Family history and personal history of risk and conditions may change your health care provider's recommendations.   This information is not intended to replace advice given to you by your health care provider. Make sure you discuss any questions you have with your health care provider.   Document Released: 10/21/2001 Document Revised: 09/15/2014 Document Reviewed: 01/20/2011 Elsevier Interactive Patient Education [employer].

## 2016-05-02 NOTE — Progress Notes (Signed)
   Subjective:     Sarah Blevins is a 35 y.o. female and is here for a comprehensive physical exam. The patient reports no problems. Using condoms--normal cycles.  Social History   Social History  . Marital status: Married    Spouse name: N/A  . Number of children: N/A  . Years of education: N/A   Occupational History  . Not on file.   Social History Main Topics  . Smoking status: Never Smoker  . Smokeless tobacco: Never Used  . Alcohol use No  . Drug use: No  . Sexual activity: Yes    Birth control/ protection: Condom   Other Topics Concern  . Not on file   Social History Narrative  . No narrative on file   Health Maintenance  Topic Date Due  . INFLUENZA VACCINE  04/08/2016  . PAP SMEAR  08/30/2017  . TETANUS/TDAP  05/30/2024  . HIV Screening  Completed    The following portions of the patient's history were reviewed and updated as appropriate: allergies, current medications, past family history, past medical history, past social history, past surgical history and problem list.  Review of Systems Pertinent items noted in HPI and remainder of comprehensive ROS otherwise negative.   Objective:    BP 112/76   Pulse 74   Ht 5\' 3"  (1.6 m)   Wt 117 lb (53.1 kg)   LMP 04/23/2016   Breastfeeding? No   BMI 20.73 kg/m  General appearance: alert, cooperative and appears stated age Head: Normocephalic, without obvious abnormality, atraumatic Neck: no adenopathy, supple, symmetrical, trachea midline and thyroid not enlarged, symmetric, no tenderness/mass/nodules Lungs: clear to auscultation bilaterally Breasts: normal appearance, no masses or tenderness Heart: regular rate and rhythm, S1, S2 normal, no murmur, click, rub or gallop Abdomen: soft, non-tender; bowel sounds normal; no masses,  no organomegaly Pelvic: cervix normal in appearance, external genitalia normal, no adnexal masses or tenderness, no cervical motion tenderness, uterus normal size, shape, and  consistency and vagina normal without discharge Extremities: extremities normal, atraumatic, no cyanosis or edema Pulses: 2+ and symmetric Skin: Skin color, texture, turgor normal. No rashes or lesions Lymph nodes: Cervical, supraclavicular, and axillary nodes normal. Neurologic: Grossly normal    Assessment:    Healthy female exam.      Plan:   Problem List Items Addressed This Visit    None    Visit Diagnoses    Encounter for routine gynecological examination    -  Primary   Relevant Orders   CBC   Comprehensive metabolic panel   TSH   Lipid panel   Vitamin D (25 hydroxy)   Screening for malignant neoplasm of cervix       Relevant Orders   Cytology - PAP         See After Visit Summary for Counseling Recommendations

## 2016-05-03 LAB — VITAMIN D 25 HYDROXY (VIT D DEFICIENCY, FRACTURES): Vit D, 25-Hydroxy: 15 ng/mL — ABNORMAL LOW (ref 30–100)

## 2016-05-06 ENCOUNTER — Telehealth: Payer: Self-pay | Admitting: *Deleted

## 2016-05-06 ENCOUNTER — Encounter: Payer: Self-pay | Admitting: *Deleted

## 2016-05-06 DIAGNOSIS — E559 Vitamin D deficiency, unspecified: Secondary | ICD-10-CM

## 2016-05-06 MED ORDER — VITAMIN D (ERGOCALCIFEROL) 1.25 MG (50000 UNIT) PO CAPS: 50000.0000 [IU] | ORAL_CAPSULE | ORAL | 0 refills | Status: DC

## 2016-05-06 NOTE — Telephone Encounter (Signed)
-----   Message from Reva Boresanya S Pratt, MD sent at 05/05/2016  4:14 PM EDT ----- Labs are ok excpet her vitamin d is very low needs 10,000 units weekly x 8 wks then 1,000 units daily and re-check in 6 months.

## 2016-05-06 NOTE — Telephone Encounter (Signed)
Called pt to adv Vitamin D sent to pharmacy and to come in for recheck in 6 months after she finished the Vit D. LMOM for pt to call back to make appt.

## 2016-05-07 LAB — CYTOLOGY - PAP

## 2016-06-30 ENCOUNTER — Encounter: Payer: Self-pay | Admitting: Internal Medicine

## 2016-06-30 ENCOUNTER — Ambulatory Visit (INDEPENDENT_AMBULATORY_CARE_PROVIDER_SITE_OTHER): Payer: Managed Care, Other (non HMO) | Admitting: Internal Medicine

## 2016-06-30 VITALS — BP 112/72 | HR 72 | Temp 98.9°F | Ht 62.5 in | Wt 119.5 lb

## 2016-06-30 DIAGNOSIS — R5383 Other fatigue: Secondary | ICD-10-CM

## 2016-06-30 DIAGNOSIS — K2900 Acute gastritis without bleeding: Secondary | ICD-10-CM

## 2016-06-30 MED ORDER — OMEPRAZOLE 20 MG PO CPDR: 20.0000 mg | DELAYED_RELEASE_CAPSULE | Freq: Every day | ORAL | 0 refills | Status: DC

## 2016-06-30 NOTE — Progress Notes (Signed)
HPI  Pt presents to the clinic today to establish care. She has not had a PCP in many years, but has been following with GYN.  Flu: 06/2014 Tetanus: 05/2014 Pap Smear: 05/02/2016 Dentist: as needed  She reports she is tired all the time. She works at night and has to watch her kids during the day. She is averaging about 4 hours of sleep per day.  She also reports abdominal pain. This started 3-4 weeks ago. She describes the pain as aching and sore. It seems worse after she eats. She has nausea but no vomiting. She denies diarrhea, constipation or blood in her stool. She reports her bowels are moving normally. She has taken Gas X with some relief. She denies changes in diet or medication.  Past Medical History:  Diagnosis Date  . Medical history non-contributory   . No pertinent past medical history     Current Outpatient Prescriptions  Medication Sig Dispense Refill  . Multiple Vitamin (MULTIVITAMIN) tablet Take 1 tablet by mouth daily.    . Vitamin D, Ergocalciferol, (DRISDOL) 50000 units CAPS capsule Take 1 capsule (50,000 Units total) by mouth every 7 (seven) days. 8 capsule 0  . vitamin E 100 UNIT capsule Take by mouth daily.     No current facility-administered medications for this visit.     No Known Allergies  Family History  Problem Relation Age of Onset  . Anemia Mother   . Hypertension Father     Social History   Social History  . Marital status: Married    Spouse name: N/A  . Number of children: N/A  . Years of education: N/A   Occupational History  . Not on file.   Social History Main Topics  . Smoking status: Never Smoker  . Smokeless tobacco: Never Used  . Alcohol use No  . Drug use: No  . Sexual activity: Yes    Birth control/ protection: Condom   Other Topics Concern  . Not on file   Social History Narrative  . No narrative on file    ROS:  Constitutional: Pt reports fatigue. Denies fever, malaise, headache or abrupt weight changes.   Respiratory: Denies difficulty breathing, shortness of breath, cough or sputum production.   Cardiovascular: Denies chest pain, chest tightness, palpitations or swelling in the hands or feet.  Gastrointestinal: Pt reports abdominal pain. Denies bloating, constipation, diarrhea or blood in the stool.  GU: Denies frequency, urgency, pain with urination, blood in urine, odor or discharge. Neurological: Denies dizziness, difficulty with memory, difficulty with speech or problems with balance and coordination.  Psych: Denies anxiety, depression, SI/HI.  No other specific complaints in a complete review of systems (except as listed in HPI above).  PE:  BP 112/72   Pulse 72   Temp 98.9 F (37.2 C) (Oral)   Ht 5' 2.5" (1.588 m)   Wt 119 lb 8 oz (54.2 kg)   LMP 06/29/2016 (Exact Date)   SpO2 99%   BMI 21.51 kg/m  Wt Readings from Last 3 Encounters:  06/30/16 119 lb 8 oz (54.2 kg)  05/02/16 117 lb (53.1 kg)  10/03/14 120 lb (54.4 kg)    General: Appears her stated age, well developed, well nourished in NAD. Cardiovascular: Normal rate and rhythm. S1,S2 noted.  No murmur, rubs or gallops noted. No JVD or BLE edema. No carotid bruits noted. Pulmonary/Chest: Normal effort and positive vesicular breath sounds. No respiratory distress. No wheezes, rales or ronchi noted.  Abdomen: Soft and tender in  the epigastric region. Normal bowel sounds. No distention or masses noted. Liver, spleen and kidneys non palpable. Neurological: Alert and oriented.  Psychiatric: Mood and affect normal. Behavior is normal. Judgment and thought content normal.    BMET    Component Value Date/Time   NA 139 05/02/2016 0848   K 3.8 05/02/2016 0848   CL 105 05/02/2016 0848   CO2 22 05/02/2016 0848   GLUCOSE 63 (L) 05/02/2016 0848   BUN 14 05/02/2016 0848   CREATININE 0.68 05/02/2016 0848   CALCIUM 9.2 05/02/2016 0848    Lipid Panel     Component Value Date/Time   CHOL 140 05/02/2016 0848   TRIG 134  05/02/2016 0848   HDL 56 05/02/2016 0848   CHOLHDL 2.5 05/02/2016 0848   VLDL 27 05/02/2016 0848   LDLCALC 57 05/02/2016 0848    CBC    Component Value Date/Time   WBC 7.9 05/02/2016 0848   RBC 4.65 05/02/2016 0848   HGB 13.6 05/02/2016 0848   HCT 40.6 05/02/2016 0848   PLT 269 05/02/2016 0848   MCV 87.3 05/02/2016 0848   MCH 29.2 05/02/2016 0848   MCHC 33.5 05/02/2016 0848   RDW 12.7 05/02/2016 0848   LYMPHSABS 1.9 05/03/2014 1112   MONOABS 0.6 05/03/2014 1112   EOSABS 0.1 05/03/2014 1112   BASOSABS 0.0 05/03/2014 1112    Hgb A1C No results found for: HGBA1C   Assessment and Plan:  Gastritis:  eRx for Prilosec 20 mg daily, to be taken 30 minutes prior to breakfast Avoid NSAID's Try to see if you identify any triggers and avoid them  Fatigue:  Due to lack of sleep Advised her to try to get someone to watch her kids so that she can get at least 7 hours of sleep daily  RTC in 2 weeks if symptoms persist, sooner if worsens  Nicki ReaperBAITY, Veniamin Kincaid, NP

## 2016-06-30 NOTE — Patient Instructions (Signed)

## 2016-12-02 ENCOUNTER — Telehealth: Payer: Self-pay | Admitting: Internal Medicine

## 2016-12-02 ENCOUNTER — Encounter: Payer: Self-pay | Admitting: Adult Health

## 2016-12-02 ENCOUNTER — Ambulatory Visit (INDEPENDENT_AMBULATORY_CARE_PROVIDER_SITE_OTHER): Payer: Commercial Managed Care - PPO | Admitting: Adult Health

## 2016-12-02 VITALS — BP 124/62 | Temp 98.0°F | Wt 121.8 lb

## 2016-12-02 DIAGNOSIS — M5412 Radiculopathy, cervical region: Secondary | ICD-10-CM | POA: Diagnosis not present

## 2016-12-02 DIAGNOSIS — T148XXA Other injury of unspecified body region, initial encounter: Secondary | ICD-10-CM

## 2016-12-02 MED ORDER — KETOROLAC TROMETHAMINE 60 MG/2ML IM SOLN
60.0000 mg | Freq: Once | INTRAMUSCULAR | Status: AC
  Administered 2016-12-02: 60 mg via INTRAMUSCULAR

## 2016-12-02 MED ORDER — METHYLPREDNISOLONE 4 MG PO TBPK: ORAL_TABLET | ORAL | 0 refills | Status: DC

## 2016-12-02 MED ORDER — CYCLOBENZAPRINE HCL 10 MG PO TABS: 10.0000 mg | ORAL_TABLET | Freq: Three times a day (TID) | ORAL | 0 refills | Status: DC | PRN

## 2016-12-02 NOTE — Patient Instructions (Signed)
It was great meeting you today .   I think you have a muscle strain.   I have sent in a prescription for Prednisone which is a steroid and Flexeril, which is a muscle relaxer. The muscle relaxer may make you sleepy.   Use a heating pad and take Motrin 600 mg   Follow up with your PCP if no improvement

## 2016-12-02 NOTE — Telephone Encounter (Signed)
Patient Name: Sarah DonathMARILYN Azure  DOB: 10/20/1980    Initial Comment Caller states, she needs to be seen - neck pain and stiffness, pain radiating to lower back, hand is numb. Verified    Nurse Assessment  Nurse: Sherilyn CooterHenry, RN, Thurmond ButtsWade Date/Time Lamount Cohen(Eastern Time): 12/02/2016 11:25:38 AM  Confirm and document reason for call. If symptomatic, describe symptoms. ---Caller states that she has neck pain and stiffness which began last night at work. It radiates into her lower back and she has numbness in her right hand. She denies injury to her head and neck. She rates her pain as 8 on 0-10 scale. She can touch her chin to her chest. She took Aleve for the pain and IBU but neither helped her with pain.  Does the patient have any new or worsening symptoms? ---Yes  Will a triage be completed? ---Yes  Related visit to physician within the last 2 weeks? ---No  Does the PT have any chronic conditions? (i.e. diabetes, asthma, etc.) ---No  Is the patient pregnant or possibly pregnant? (Ask all females between the ages of 2312-55) ---No  Is this a behavioral health or substance abuse call? ---No     Guidelines    Guideline Title Affirmed Question Affirmed Notes  Neck Pain or Stiffness [1] SEVERE neck pain (e.g., excruciating, unable to do any normal activities) AND [2] not improved after 2 hours of pain medicine    Final Disposition User   See Physician within 4 Hours (or PCP triage) Sherilyn CooterHenry, RN, Wade    Referrals  REFERRED TO PCP OFFICE   Disagree/Comply: Danella Maiersomply

## 2016-12-02 NOTE — Telephone Encounter (Signed)
[facility]

## 2016-12-02 NOTE — Progress Notes (Signed)
Subjective:    Patient ID: Sarah Blevins, female    DOB: 09/08/1981, 36 y.o.   MRN: [id number]  HPI  36 year old female who presents to the office today for the acute complaint of neck pain, middle back pain and tingling in her right arm. She reports that he symptoms started last night at work. She works in [employer] and does heavy lifting.   She reports that as the night went on, she the pain and stiffness in her neck and back became worse. Today in the office she reports that she is unable to twist her neck without pain and has pain in her upper back. She continues to have tingling in her right hand and feels as though her grip strength has decreased  The pain is described as "sore and tight".  Review of Systems See HPI   No past medical history on file.  Social History   Social History  . Marital status: Married    Spouse name: N/A  . Number of children: N/A  . Years of education: N/A   Occupational History  . Not on file.   Social History Main Topics  . Smoking status: Never Smoker  . Smokeless tobacco: Never Used  . Alcohol use No  . Drug use: No  . Sexual activity: Yes    Birth control/ protection: Condom   Other Topics Concern  . Not on file   Social History Narrative  . No narrative on file    Past Surgical History:  Procedure Laterality Date  . CESAREAN SECTION N/A 06/15/2014   Procedure: CESAREAN SECTION;  Surgeon: Willodean Rosenthal, MD;  Location: [facility];  Service: Obstetrics;  Laterality: N/A;  . LEG SURGERY     right leg blood cyst removal.  . NO PAST SURGERIES    . WISDOM TOOTH EXTRACTION     x2    Family History  Problem Relation Age of Onset  . Anemia Mother   . Hypertension Father     No Known Allergies  No current outpatient prescriptions on file prior to visit.   No current facility-administered medications on file prior to visit.     BP 124/62 (BP Location: Right Arm, Patient Position: Sitting, Cuff Size: Normal)    Temp 98 F (36.7 C) (Oral)   Wt 121 lb 12.8 oz (55.2 kg)   BMI 21.92 kg/m       Objective:   Physical Exam  Constitutional: She is oriented to person, place, and time. She appears well-developed and well-nourished. No distress.  Cardiovascular: Normal rate, regular rhythm, normal heart sounds and intact distal pulses.  Exam reveals no gallop and no friction rub.   No murmur heard. Pulmonary/Chest: Effort normal and breath sounds normal. No respiratory distress. She has no wheezes. She has no rales. She exhibits no tenderness.  Musculoskeletal: She exhibits tenderness (cervical and lumbar spine. as well as bilateral scapula and trapezoid ).  Has difficulty moving head horizontally.   Negative nuchal rigidity and Kurnigs sign  Neurological: She is alert and oriented to person, place, and time.  Decreased grip strength in right hand   Skin: Skin is warm and dry. No rash noted. She is not diaphoretic. No erythema. No pallor.  Nursing note and vitals reviewed.     Assessment & Plan:  1. Muscle strain - methylPREDNISolone (MEDROL DOSEPAK) 4 MG TBPK tablet; Take as directed  Dispense: 21 tablet; Refill: 0 - cyclobenzaprine (FLEXERIL) 10 MG tablet; Take 1 tablet (10 mg total)  by mouth 3 (three) times daily as needed for muscle spasms.  Dispense: 30 tablet; Refill: 0 - ketorolac (TORADOL) injection 60 mg; Inject 2 mLs (60 mg total) into the muscle once. - Follow up with PCP if no improvement  2. Cervical radiculopathy - methylPREDNISolone (MEDROL DOSEPAK) 4 MG TBPK tablet; Take as directed  Dispense: 21 tablet; Refill: 0 - cyclobenzaprine (FLEXERIL) 10 MG tablet; Take 1 tablet (10 mg total) by mouth 3 (three) times daily as needed for muscle spasms.  Dispense: 30 tablet; Refill: 0 - Follow up with PCP if no improvement   Shirline Freesory Sarah Baez, NP

## 2017-06-12 ENCOUNTER — Ambulatory Visit (INDEPENDENT_AMBULATORY_CARE_PROVIDER_SITE_OTHER): Payer: Commercial Managed Care - PPO | Admitting: Internal Medicine

## 2017-06-12 ENCOUNTER — Encounter: Payer: Self-pay | Admitting: Internal Medicine

## 2017-06-12 VITALS — BP 116/70 | HR 67 | Temp 98.0°F | Wt 120.0 lb

## 2017-06-12 DIAGNOSIS — N644 Mastodynia: Secondary | ICD-10-CM | POA: Diagnosis not present

## 2017-06-12 NOTE — Patient Instructions (Signed)

## 2017-06-12 NOTE — Progress Notes (Signed)
Subjective:    Patient ID: Sarah Blevins, female    DOB: [birth year], 36 y.o.   MRN: [id number]  HPI  Pt presents to the clinic today with c/o left breast pain. She reports this started 3 weeks ago. The pain comes and goes. Nothing she does makes the pain come on. It can be worse with certain movements or when laying on her left side. The pain resolves on its own. She is concerned that her left breast is bigger than the right. She denies changes in the skin of the breast or discharge from the nipple. She denies injury that could have strained the pectoral muscle. She has not had a mammogram before.  Review of Systems      No past medical history on file.  No current outpatient prescriptions on file.   No current facility-administered medications for this visit.     No Known Allergies  Family History  Problem Relation Age of Onset  . Anemia Mother   . Hypertension Father     Social History   Social History  . Marital status: Married    Spouse name: N/A  . Number of children: N/A  . Years of education: N/A   Occupational History  . Not on file.   Social History Main Topics  . Smoking status: Never Smoker  . Smokeless tobacco: Never Used  . Alcohol use No  . Drug use: No  . Sexual activity: Yes    Birth control/ protection: Condom   Other Topics Concern  . Not on file   Social History Narrative  . No narrative on file     Constitutional: Denies fever, malaise, fatigue, headache or abrupt weight changes.  Skin: Pt reports breast pain. Denies redness, rashes, lesions or ulcercations.   No other specific complaints in a complete review of systems (except as listed in HPI above).  Objective:   Physical Exam  BP 116/70   Pulse 67   Temp 98 F (36.7 C) (Oral)   Wt 120 lb (54.4 kg)   LMP 05/25/2017   SpO2 98%   BMI 21.60 kg/m  Wt Readings from Last 3 Encounters:  06/12/17 120 lb (54.4 kg)  12/02/16 121 lb 12.8 oz (55.2 kg)  06/30/16 119 lb 8 oz  (54.2 kg)    General: Appears her stated age, well developed, well nourished in NAD. Skin: Breast symmetrical. Fibrocystic changes noted bilateral. No discharge noted from the nipple. She does c/o of tenderness with palpation of the left breast from 12-3 O'Clock.  BMET    Component Value Date/Time   NA 139 05/02/2016 0848   K 3.8 05/02/2016 0848   CL 105 05/02/2016 0848   CO2 22 05/02/2016 0848   GLUCOSE 63 (L) 05/02/2016 0848   BUN 14 05/02/2016 0848   CREATININE 0.68 05/02/2016 0848   CALCIUM 9.2 05/02/2016 0848    Lipid Panel     Component Value Date/Time   CHOL 140 05/02/2016 0848   TRIG 134 05/02/2016 0848   HDL 56 05/02/2016 0848   CHOLHDL 2.5 05/02/2016 0848   VLDL 27 05/02/2016 0848   LDLCALC 57 05/02/2016 0848    CBC    Component Value Date/Time   WBC 7.9 05/02/2016 0848   RBC 4.65 05/02/2016 0848   HGB 13.6 05/02/2016 0848   HCT 40.6 05/02/2016 0848   PLT 269 05/02/2016 0848   MCV 87.3 05/02/2016 0848   MCH 29.2 05/02/2016 0848   MCHC 33.5 05/02/2016 0848   RDW  12.7 05/02/2016 0848   LYMPHSABS 1.9 05/03/2014 1112   MONOABS 0.6 05/03/2014 1112   EOSABS 0.1 05/03/2014 1112   BASOSABS 0.0 05/03/2014 1112    Hgb A1C No results found for: HGBA1C          Assessment & Plan:   Left Breast Pain:  Will obtain diagnostic mammogram of both breast and ultrasound of left breast  Will follow up after imaging has resulted, return precautions discussed Nicki Reaper, NP

## 2017-07-01 ENCOUNTER — Encounter: Payer: Self-pay | Admitting: Family Medicine

## 2017-07-01 ENCOUNTER — Encounter: Payer: Self-pay | Admitting: *Deleted

## 2017-07-01 ENCOUNTER — Ambulatory Visit (INDEPENDENT_AMBULATORY_CARE_PROVIDER_SITE_OTHER): Payer: Commercial Managed Care - PPO | Admitting: Family Medicine

## 2017-07-01 VITALS — BP 98/64 | HR 91 | Temp 98.4°F | Wt 119.6 lb

## 2017-07-01 DIAGNOSIS — R300 Dysuria: Secondary | ICD-10-CM | POA: Diagnosis not present

## 2017-07-01 LAB — POCT URINALYSIS DIPSTICK
BILIRUBIN UA: NEGATIVE
Glucose, UA: NEGATIVE
KETONES UA: NEGATIVE
Nitrite, UA: NEGATIVE
Protein, UA: NEGATIVE
SPEC GRAV UA: 1.01 (ref 1.010–1.025)
Urobilinogen, UA: 0.2 E.U./dL
pH, UA: 7 (ref 5.0–8.0)

## 2017-07-01 MED ORDER — NITROFURANTOIN MONOHYD MACRO 100 MG PO CAPS: 100.0000 mg | ORAL_CAPSULE | Freq: Two times a day (BID) | ORAL | 0 refills | Status: DC

## 2017-07-01 NOTE — Patient Instructions (Signed)

## 2017-07-01 NOTE — Progress Notes (Signed)
Subjective:     Patient ID: Sarah Blevins, female   DOB: 11/26/1980, 36 y.o.   MRN: [id number]  HPI Patient seen as a work in with onset yesterday of urine frequency and some burning with urination. She has some suprapubic discomfort and occasional pains lower back region somewhat midline. She denies any fevers or chills. She's had some mild nausea today but no vomiting. Denies any recent UTI history. She is keeping down fluids but poor appetite. No known drug allergies.  No past medical history on file. Past Surgical History:  Procedure Laterality Date  . CESAREAN SECTION N/A 06/15/2014   Procedure: CESAREAN SECTION;  Surgeon: Willodean Rosenthalarolyn Harraway-Smith, MD;  Location: [facility];  Service: Obstetrics;  Laterality: N/A;  . LEG SURGERY     right leg blood cyst removal.  . NO PAST SURGERIES    . WISDOM TOOTH EXTRACTION     x2    reports that she has never smoked. She has never used smokeless tobacco. She reports that she does not drink alcohol or use drugs. family history includes Anemia in her mother; Hypertension in her father. No Known Allergies   Review of Systems  Constitutional: Negative for appetite change, chills and fever.  Gastrointestinal: Negative for abdominal pain, constipation, diarrhea, nausea and vomiting.  Genitourinary: Positive for dysuria and frequency.  Musculoskeletal: Negative for back pain.  Neurological: Negative for dizziness.       Objective:   Physical Exam  Constitutional: She appears well-developed and well-nourished.  HENT:  Head: Normocephalic and atraumatic.  Neck: Neck supple. No thyromegaly present.  Cardiovascular: Normal rate, regular rhythm and normal heart sounds.   Pulmonary/Chest: Breath sounds normal.  Abdominal: Soft. Bowel sounds are normal. There is no tenderness.  Musculoskeletal:  No flank tenderness.       Assessment:     Dysuria. Suspect urinary tract infection.  Patient has 3+ leukocytes and positive blood    Plan:      -urine culture sent. -start Macrobid 1 twice a day pending culture results -Push fluids -follow-up promptly for any fever, vomiting, or other concerns  Kristian CoveyBruce W Chalonda Schlatter MD [facility]

## 2017-07-02 NOTE — Addendum Note (Signed)
Addended by: Charna ElizabethLEMMONS, Illeana Edick L on: 07/02/2017 08:31 AM   Modules accepted: Orders

## 2017-07-03 ENCOUNTER — Encounter: Payer: Self-pay | Admitting: Obstetrics & Gynecology

## 2017-07-03 ENCOUNTER — Encounter: Payer: Self-pay | Admitting: *Deleted

## 2017-07-03 ENCOUNTER — Ambulatory Visit (INDEPENDENT_AMBULATORY_CARE_PROVIDER_SITE_OTHER): Payer: Commercial Managed Care - PPO | Admitting: Obstetrics & Gynecology

## 2017-07-03 VITALS — BP 105/69 | HR 73 | Ht 63.0 in | Wt 119.6 lb

## 2017-07-03 DIAGNOSIS — Z124 Encounter for screening for malignant neoplasm of cervix: Secondary | ICD-10-CM | POA: Diagnosis not present

## 2017-07-03 DIAGNOSIS — E559 Vitamin D deficiency, unspecified: Secondary | ICD-10-CM

## 2017-07-03 DIAGNOSIS — Z01419 Encounter for gynecological examination (general) (routine) without abnormal findings: Secondary | ICD-10-CM | POA: Diagnosis not present

## 2017-07-03 DIAGNOSIS — Z1151 Encounter for screening for human papillomavirus (HPV): Secondary | ICD-10-CM | POA: Diagnosis not present

## 2017-07-03 NOTE — Progress Notes (Signed)
Subjective:    Sarah ChouMarilyn N Blevins is a 36 y.o. Married P2 (4 and 36 yo kids) female who presents for an annual exam. The patient has no complaints today. The patient is sexually active. GYN screening history: last pap: was normal. The patient wears seatbelts: yes. The patient participates in regular exercise: yes. Has the patient ever been transfused or tattooed?: no. The patient reports that there is not domestic violence in her life.   Menstrual History: OB History    Gravida Para Term Preterm AB Living   2 2 2  0 0 2   SAB TAB Ectopic Multiple Live Births   0 0 0 0 2      Menarche age: [age] Patient's last menstrual period was 06/17/2017 (approximate).    The following portions of the patient's history were reviewed and updated as appropriate: allergies, current medications, past family history, past medical history, past social history, past surgical history and problem list.  Review of Systems Pertinent items are noted in HPI.   Works at [employer] Married for 5 years Uses condoms, withdrawal for contraception. Her husband plans to get a vasectomy FH- no breast/gyn/colon cancer   Objective:    BP 105/69   Pulse 73   Ht 5\' 3"  (1.6 m)   Wt 119 lb 9.6 oz (54.3 kg)   LMP 06/17/2017 (Approximate)   BMI 21.19 kg/m   General Appearance:    Alert, cooperative, no distress, appears stated age  Head:    Normocephalic, without obvious abnormality, atraumatic  Eyes:    PERRL, conjunctiva/corneas clear, EOM's intact, fundi    benign, both eyes  Ears:    Normal TM's and external ear canals, both ears  Nose:   Nares normal, septum midline, mucosa normal, no drainage    or sinus tenderness  Throat:   Lips, mucosa, and tongue normal; teeth and gums normal  Neck:   Supple, symmetrical, trachea midline, no adenopathy;    thyroid:  no enlargement/tenderness/nodules; no carotid   bruit or JVD  Back:     Symmetric, no curvature, ROM normal, no CVA tenderness  Lungs:     Clear to auscultation  bilaterally, respirations unlabored  Chest Wall:    No tenderness or deformity   Heart:    Regular rate and rhythm, S1 and S2 normal, no murmur, rub   or gallop  Breast Exam:    No tenderness, masses, or nipple abnormality  Abdomen:     Soft, non-tender, bowel sounds active all four quadrants,    no masses, no organomegaly  Genitalia:    Normal female without lesion, discharge or tenderness, NSSA, NT, normal adnexal exam     Extremities:   Extremities normal, atraumatic, no cyanosis or edema  Pulses:   2+ and symmetric all extremities  Skin:   Skin color, texture, turgor normal, no rashes or lesions  Lymph nodes:   Cervical, supraclavicular, and axillary nodes normal  Neurologic:   CNII-XII intact, normal strength, sensation and reflexes    throughout  .    Assessment:    Healthy female exam.    Plan:     Thin prep Pap smear. with cotesting Recheck Vitamin D She declines a flu vaccine

## 2017-07-04 LAB — VITAMIN D 25 HYDROXY (VIT D DEFICIENCY, FRACTURES): Vit D, 25-Hydroxy: 27.8 ng/mL — ABNORMAL LOW (ref 30.0–100.0)

## 2017-07-05 LAB — URINE CULTURE
MICRO NUMBER: 81196885
SPECIMEN QUALITY: ADEQUATE

## 2017-07-06 ENCOUNTER — Other Ambulatory Visit: Payer: Self-pay

## 2017-07-06 MED ORDER — VITAMIN D (ERGOCALCIFEROL) 1.25 MG (50000 UNIT) PO CAPS: 50000.0000 [IU] | ORAL_CAPSULE | ORAL | 2 refills | Status: DC

## 2017-07-06 NOTE — Telephone Encounter (Signed)
Vitamin D is low per Dr.Dove patient will need 8 weeks of 50,000 of vitamin D

## 2017-07-07 LAB — CYTOLOGY - PAP
ADEQUACY: ABSENT
DIAGNOSIS: NEGATIVE
HPV (WINDOPATH): NOT DETECTED

## 2017-07-13 ENCOUNTER — Telehealth: Payer: Self-pay | Admitting: *Deleted

## 2017-07-13 NOTE — Telephone Encounter (Signed)
Tried calling pt to let her know Vitamin D has been sent to pharmacy and she will need to take one per week for 8 wks. VM nto set up and not able to leave pt a message.

## 2017-07-13 NOTE — Telephone Encounter (Signed)
-----   Message from Allie BossierMyra C Dove, MD sent at 07/09/2017 12:01 PM EDT ----- She will need 8 weeks of weekly Vitamin D 50,000 units and then a recheck of her level. Thanks

## 2017-08-18 ENCOUNTER — Telehealth: Payer: Self-pay | Admitting: Internal Medicine

## 2017-08-18 DIAGNOSIS — N644 Mastodynia: Secondary | ICD-10-CM

## 2017-08-18 NOTE — Telephone Encounter (Signed)
Copied from CRM [phone]. Topic: Quick Communication - See Telephone Encounter >> Jul 08, 2017  2:11 PM Carrie MewHayes, Robin B wrote: CRM for notification. See Telephone encounter for:  07/08/17. >> Jul 08, 2017  2:12 PM Carrie MewHayes, Robin B wrote: Sherron MondaySpoke to pt she will call back to let me know where to schedule her mammogram [place] or .  Please transfer to office to speak to Robin >> Aug 18, 2017 10:53 AM Elliot GaultBell, Tiffany M wrote: Unable to contact patient due to VM not being set up, inquiring where orders should be placed for mamo. Attempted 2x >> Aug 18, 2017 11:09 AM Elliot GaultBell, Tiffany M wrote: Patient returned call and would like mamo orders placed at a [place] location and preferable on a Friday due to patient working 3rd shift.  Please advise patient when orders are placed.

## 2017-08-19 NOTE — Telephone Encounter (Signed)
I have put the orders back in. Shirlee LimerickMarion will call her to schedule.

## 2017-08-19 NOTE — Telephone Encounter (Signed)
Rene KocherRegina, does this patient still need to get this done. The order was canceled.

## 2017-08-19 NOTE — Telephone Encounter (Signed)
How can I find out who cancelled the order?

## 2017-08-19 NOTE — Addendum Note (Signed)
Addended by: Lorre MunroeBAITY, Diannie Willner W on: 08/19/2017 02:04 PM   Modules accepted: Orders

## 2017-08-19 NOTE — Telephone Encounter (Signed)
It looks like either the imaging or referral coordinator   referral notes below  spoke with pt she will call back she has to ask spouse where he wants to take her to [place] or [place] 10/31 tried calling pt no voice mail 11/12/rbh

## 2017-08-20 ENCOUNTER — Other Ambulatory Visit: Payer: Self-pay | Admitting: Internal Medicine

## 2017-08-20 ENCOUNTER — Telehealth: Payer: Self-pay | Admitting: Internal Medicine

## 2017-08-20 DIAGNOSIS — N644 Mastodynia: Secondary | ICD-10-CM

## 2017-08-20 NOTE — Telephone Encounter (Signed)
Ultrasound ordered

## 2017-08-20 NOTE — Telephone Encounter (Signed)
Sarah KocherRegina Can you put in Right breast ultra sound order also   [phone] [id number] Thanks

## 2017-08-21 ENCOUNTER — Encounter: Payer: Self-pay | Admitting: Internal Medicine

## 2017-08-21 NOTE — Addendum Note (Signed)
Addended by: Eual FinesBRIDGES, Mihailo Sage P on: 08/21/2017 08:57 AM   Modules accepted: Orders

## 2017-09-04 ENCOUNTER — Ambulatory Visit
Admission: RE | Admit: 2017-09-04 | Discharge: 2017-09-04 | Disposition: A | Discharge status: Home / Self Care | Payer: Commercial Managed Care - PPO | Source: Ambulatory Visit | Attending: Internal Medicine | Admitting: Internal Medicine

## 2017-09-04 DIAGNOSIS — N644 Mastodynia: Secondary | ICD-10-CM

## 2018-04-23 ENCOUNTER — Ambulatory Visit (INDEPENDENT_AMBULATORY_CARE_PROVIDER_SITE_OTHER): Payer: Commercial Managed Care - PPO

## 2018-04-23 DIAGNOSIS — N898 Other specified noninflammatory disorders of vagina: Secondary | ICD-10-CM

## 2018-04-23 DIAGNOSIS — Z113 Encounter for screening for infections with a predominantly sexual mode of transmission: Secondary | ICD-10-CM

## 2018-04-23 NOTE — Progress Notes (Signed)
SUBJECTIVE:  37 y.o. female complains of vaginal discharge for a couple of days. Denies abnormal vaginal bleeding or significant pelvic pain or fever. No UTI symptoms. Denies history of known exposure to STD.  No LMP recorded.  OBJECTIVE:  She appears well, afebrile. Urine dipstick:   ASSESSMENT:  Vaginal Discharge small amount  Vaginal Odor small amount    PLAN:  GC, chlamydia, trichomonas, BVAG, CVAG probe sent to lab. Treatment: To be determined once lab results are received ROV prn if symptoms persist or worsen.

## 2018-04-26 ENCOUNTER — Other Ambulatory Visit: Payer: Self-pay

## 2018-04-26 LAB — CERVICOVAGINAL ANCILLARY ONLY
Bacterial vaginitis: POSITIVE — AB
CANDIDA VAGINITIS: NEGATIVE
Chlamydia: NEGATIVE
Neisseria Gonorrhea: NEGATIVE
Trichomonas: NEGATIVE

## 2018-04-26 MED ORDER — METRONIDAZOLE 500 MG PO TABS: 500.0000 mg | ORAL_TABLET | Freq: Two times a day (BID) | ORAL | 0 refills | Status: DC

## 2018-04-26 NOTE — Telephone Encounter (Signed)
Called patient to inform her of test results and need to start flagyl.

## 2018-06-28 ENCOUNTER — Ambulatory Visit (INDEPENDENT_AMBULATORY_CARE_PROVIDER_SITE_OTHER)
Admission: RE | Admit: 2018-06-28 | Discharge: 2018-06-28 | Disposition: A | Discharge status: Home / Self Care | Payer: Commercial Managed Care - PPO | Source: Ambulatory Visit | Attending: Internal Medicine | Admitting: Internal Medicine

## 2018-06-28 ENCOUNTER — Ambulatory Visit: Payer: Commercial Managed Care - PPO | Admitting: Internal Medicine

## 2018-06-28 ENCOUNTER — Encounter: Payer: Self-pay | Admitting: Internal Medicine

## 2018-06-28 ENCOUNTER — Telehealth: Payer: Self-pay | Admitting: Internal Medicine

## 2018-06-28 ENCOUNTER — Encounter (INDEPENDENT_AMBULATORY_CARE_PROVIDER_SITE_OTHER): Payer: Self-pay

## 2018-06-28 VITALS — BP 102/60 | HR 67 | Temp 98.4°F | Wt 117.0 lb

## 2018-06-28 DIAGNOSIS — M545 Low back pain, unspecified: Secondary | ICD-10-CM

## 2018-06-28 DIAGNOSIS — K644 Residual hemorrhoidal skin tags: Secondary | ICD-10-CM | POA: Diagnosis not present

## 2018-06-28 MED ORDER — HYDROCORTISONE ACETATE 25 MG RE SUPP: 25.0000 mg | Freq: Two times a day (BID) | RECTAL | 0 refills | Status: DC

## 2018-06-28 NOTE — Patient Instructions (Signed)

## 2018-06-28 NOTE — Telephone Encounter (Signed)
Copied from CRM [id number]. Topic: Quick Communication - See Telephone Encounter >> Jun 28, 2018  1:13 PM Stephannie Li, NT wrote: CRM for notification. See Telephone encounter for: 06/28/18. Patients husband called and said the patient would like a note for work that states she is on light duty , and he would like to pick this up today for her please call him at [phone]

## 2018-06-28 NOTE — Progress Notes (Signed)
Subjective:    Patient ID: Sarah Blevins, female    DOB: 1981-07-12, 37 y.o.   MRN: [id number]  HPI  Pt presents to the clinic today with c/o low back pain. She reports this started 1 month ago. She describes the pain as sore and achy but can be sharp and shooting at times. The pain radiates to the left side of her back, but she denies pain, numbness, tingling or weakness in her legs. She denies any issues with her bowel or bladder. The pain is worse with bending, better with sitting and laying down. She denies any injury to her back but reports she stoops and bends a lot for work. She has tried Aleve and Ibuprofen with some relief.   She also reports rectal pan and swelling. She noticed this 1-2 weeks ago. She has noticed some blood when she wipes. She denies abdominal pain, nausea, vomiting, constipation or diarrhea. She has used Preparation H with some relief. She has no history of hemorrhoids that she is aware of. Her bowels are moving normally 1-2 times per day.  Review of Systems  History reviewed. No pertinent past medical history.  No current outpatient medications on file.   No current facility-administered medications for this visit.     No Known Allergies  Family History  Problem Relation Age of Onset  . Anemia Mother   . Hypertension Father     Social History   Socioeconomic History  . Marital status: Married    Spouse name: Not on file  . Number of children: Not on file  . Years of education: Not on file  . Highest education level: Not on file  Occupational History  . Not on file  Social Needs  . Financial resource strain: Not on file  . Food insecurity:    Worry: Not on file    Inability: Not on file  . Transportation needs:    Medical: Not on file    Non-medical: Not on file  Tobacco Use  . Smoking status: Never Smoker  . Smokeless tobacco: Never Used  Substance and Sexual Activity  . Alcohol use: No  . Drug use: No  . Sexual activity: Yes   Birth control/protection: Condom  Lifestyle  . Physical activity:    Days per week: Not on file    Minutes per session: Not on file  . Stress: Not on file  Relationships  . Social connections:    Talks on phone: Not on file    Gets together: Not on file    Attends religious service: Not on file    Active member of club or organization: Not on file    Attends meetings of clubs or organizations: Not on file    Relationship status: Not on file  . Intimate partner violence:    Fear of current or ex partner: Not on file    Emotionally abused: Not on file    Physically abused: Not on file    Forced sexual activity: Not on file  Other Topics Concern  . Not on file  Social History Narrative  . Not on file     Constitutional: Denies fever, malaise, fatigue, headache or abrupt weight changes.  Respiratory: Denies difficulty breathing, shortness of breath, cough or sputum production.   Cardiovascular: Denies chest pain, chest tightness, palpitations or swelling in the hands or feet.  Gastrointestinal: Pt reports rectal pain, blood when wiping. Denies abdominal pain, bloating, constipation, diarrhea.  GU: Denies urgency, frequency, pain with  urination, burning sensation, blood in urine, odor or discharge. Musculoskeletal: Pt reports low back pain. Denies decrease in range of motion, difficulty with gait, muscle pain or joint swelling.  Neurological: Denies dizziness, difficulty with memory, difficulty with speech or problems with balance and coordination.    No other specific complaints in a complete review of systems (except as listed in HPI above).     Objective:   Physical Exam  BP 102/60   Pulse 67   Temp 98.4 F (36.9 C) (Oral)   Wt 117 lb (53.1 kg)   LMP 06/21/2018   SpO2 98%   BMI 20.73 kg/m  Wt Readings from Last 3 Encounters:  06/28/18 117 lb (53.1 kg)  07/03/17 119 lb 9.6 oz (54.3 kg)  07/01/17 119 lb 9.6 oz (54.3 kg)    General: Appears her stated age, well  developed, well nourished in NAD. Abdomen: Soft and nontender. Normal bowel sounds. No distention or masses noted.  Rectal: Non thrombosed or bleeding external hemorrhoid noted at 5 oclock. Musculoskeletal: Pain with flexion of the spine. Normal extension and rotation. Bony tenderness noted over the lumbar spine. Pain with palpation of bilateral SI joints. Strength 5/5 BLE. No difficulty with gait.  Neurological: Alert and oriented.    BMET    Component Value Date/Time   NA 139 05/02/2016 0848   K 3.8 05/02/2016 0848   CL 105 05/02/2016 0848   CO2 22 05/02/2016 0848   GLUCOSE 63 (L) 05/02/2016 0848   BUN 14 05/02/2016 0848   CREATININE 0.68 05/02/2016 0848   CALCIUM 9.2 05/02/2016 0848    Lipid Panel     Component Value Date/Time   CHOL 140 05/02/2016 0848   TRIG 134 05/02/2016 0848   HDL 56 05/02/2016 0848   CHOLHDL 2.5 05/02/2016 0848   VLDL 27 05/02/2016 0848   LDLCALC 57 05/02/2016 0848    CBC    Component Value Date/Time   WBC 7.9 05/02/2016 0848   RBC 4.65 05/02/2016 0848   HGB 13.6 05/02/2016 0848   HCT 40.6 05/02/2016 0848   PLT 269 05/02/2016 0848   MCV 87.3 05/02/2016 0848   MCH 29.2 05/02/2016 0848   MCHC 33.5 05/02/2016 0848   RDW 12.7 05/02/2016 0848   LYMPHSABS 1.9 05/03/2014 1112   MONOABS 0.6 05/03/2014 1112   EOSABS 0.1 05/03/2014 1112   BASOSABS 0.0 05/03/2014 1112    Hgb A1C No results found for: HGBA1C          Assessment & Plan:   External Hemorrhoid:  Avoid constipation Increase fluids and fiber intake eRx for Anusol suppositories BID x 6 days  Acute Low Back Pain:  Xray lumbar spine today Continue Aleve, Ibuprofen OTC Back exercises given Heat and massage may be helpful  Will follow up after xray, return precautions discussed Nicki Reaper, NP

## 2018-06-28 NOTE — Telephone Encounter (Signed)
I am not going to write this note until after I see the results of the xray.

## 2018-07-01 NOTE — Telephone Encounter (Signed)
Xray benign. I don't think she needs light duty. She just needs to stretch before and after working and use good posture (ergonomics) when working.

## 2018-07-02 NOTE — Telephone Encounter (Signed)
Called pt, no answer and VM not set up 

## 2018-11-30 ENCOUNTER — Ambulatory Visit: Payer: Commercial Managed Care - PPO | Admitting: Obstetrics & Gynecology

## 2018-12-20 ENCOUNTER — Ambulatory Visit (INDEPENDENT_AMBULATORY_CARE_PROVIDER_SITE_OTHER): Payer: Commercial Managed Care - PPO | Admitting: Family Medicine

## 2018-12-20 DIAGNOSIS — J069 Acute upper respiratory infection, unspecified: Secondary | ICD-10-CM | POA: Diagnosis not present

## 2018-12-20 MED ORDER — LORATADINE 10 MG PO TABS: 10.0000 mg | ORAL_TABLET | Freq: Every day | ORAL | Status: DC | PRN

## 2018-12-20 MED ORDER — BENZONATATE 200 MG PO CAPS: 200.0000 mg | ORAL_CAPSULE | Freq: Three times a day (TID) | ORAL | 1 refills | Status: DC | PRN

## 2018-12-20 MED ORDER — FLUTICASONE PROPIONATE 50 MCG/ACT NA SUSP: 2.0000 | Freq: Every day | NASAL | Status: DC | PRN

## 2018-12-20 NOTE — Assessment & Plan Note (Signed)
URI vs seasonal allergies vs both. No sign of ominous dx.  Okay for outpatient f/u. No need for covid testing.  D/w pt.  She agrees.  Supportive care in meantime.  Out of work for now.  She can print note at home or call us if a copy is needed.  Try tessalon for cough.  Can take claritin 10mg  a day.  Flonase if needed.  She agrees with plan and to update [place] as needed.

## 2018-12-20 NOTE — Progress Notes (Signed)
Virtual visit completed through WebEx.  Patient location: home  Provider location: [place] at [facility], office   HPI: Started about a week ago with sneezing. Took an OTC allergy medication/claritin and sneezing got a little better. Throat feels "dry and scratchy" for a few days. Dec in appetite. Fatigue. No fever.  Her sense of taste is blunted some.  She doesn't feel better than yesterday.  Some cough, meaning throat clearing, as of today.  Not SOB.  HA, B temporal areas.  No sputum.  No wheeze.  Sinuses not ttp.    Meds and allergies reviewed.   ROS: Per HPI unless specifically indicated in ROS section   NAD Speech wnl  A/P: URI vs seasonal allergies vs both. No sign of ominous dx.  Okay for outpatient f/u. No need for covid testing.  D/w pt.  She agrees.  Supportive care in meantime.  Out of work for now.  She can print note at home or call us if a copy is needed.  Try tessalon for cough.  Can take claritin 10mg  a day.  Flonase if needed.  She agrees with plan and to update [place] as needed.

## 2018-12-22 ENCOUNTER — Telehealth: Payer: Self-pay | Admitting: Internal Medicine

## 2018-12-22 NOTE — Telephone Encounter (Signed)
Patient had an appointment on 12/20/2018 for upper respiratory.

## 2018-12-22 NOTE — Telephone Encounter (Signed)
Best number [phone] Spouse Thayer Ohm called stating pt needs a note to go back to work on 12/23/2018

## 2018-12-22 NOTE — Telephone Encounter (Signed)
Note done.  In EMR.  Should be able to print at home.  Print and send to patient if needed.  Thanks.

## 2018-12-23 NOTE — Telephone Encounter (Signed)
Spouse aware of dr Para March comments

## 2018-12-24 ENCOUNTER — Telehealth: Payer: Self-pay | Admitting: Internal Medicine

## 2018-12-24 NOTE — Telephone Encounter (Signed)
FMLA papework in Dr Lianne Bushy in box  For review and signature

## 2018-12-26 NOTE — Telephone Encounter (Signed)
Forms signed. Thanks!

## 2018-12-28 NOTE — Telephone Encounter (Signed)
Paperwork faxed °

## 2018-12-30 ENCOUNTER — Ambulatory Visit: Payer: Commercial Managed Care - PPO | Admitting: Obstetrics & Gynecology

## 2018-12-31 NOTE — Telephone Encounter (Signed)
Tried calling pt no answer Please let pt know her paperwork was faxed on 4/21 and there is a copy here for her  Copy for pt Copy for scan

## 2019-03-08 ENCOUNTER — Ambulatory Visit (INDEPENDENT_AMBULATORY_CARE_PROVIDER_SITE_OTHER): Payer: Commercial Managed Care - PPO | Admitting: Family Medicine

## 2019-03-08 ENCOUNTER — Encounter: Payer: Self-pay | Admitting: Family Medicine

## 2019-03-08 ENCOUNTER — Other Ambulatory Visit: Payer: Self-pay

## 2019-03-08 VITALS — BP 116/60 | HR 78 | Temp 98.2°F | Wt 114.2 lb

## 2019-03-08 DIAGNOSIS — M7541 Impingement syndrome of right shoulder: Secondary | ICD-10-CM

## 2019-03-08 DIAGNOSIS — M26609 Unspecified temporomandibular joint disorder, unspecified side: Secondary | ICD-10-CM | POA: Diagnosis not present

## 2019-03-08 NOTE — Assessment & Plan Note (Signed)
Significant pain with several testing. At this point suspect impingement, however, could also be supraspinatus injury. Advised, rest NSAIDS, and work not provided. Declined PT today, but will call back not resolving quickly.

## 2019-03-08 NOTE — Assessment & Plan Note (Signed)
Given hx the most likely cause of her symptoms. NSAIDs, jaw exercises. Also advised discussing with dentist at next visit.

## 2019-03-08 NOTE — Progress Notes (Addendum)
Subjective:     Sarah Blevins is a 38 y.o. female presenting for Jaw Pain (x 3 months. 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  HENT: Negative for trouble swallowing.   Neurological: Positive for numbness.     Social History   Tobacco Use  Smoking Status Never Smoker  Smokeless Tobacco Never Used        Objective:    BP Readings from Last 3 Encounters:  03/08/19 116/60  06/28/18 102/60  07/03/17 105/69   Wt Readings from Last 3 Encounters:  03/08/19 114 lb 4 oz (51.8 kg)  06/28/18 117 lb (53.1 kg)  07/03/17 119 lb 9.6 oz (54.3 kg)    BP 116/60   Pulse 78   Temp 98.2 F (36.8 C)   Wt 114 lb 4 oz (51.8 kg)   BMI 20.24 kg/m    Physical Exam Constitutional:      General: She is not in acute distress.    Appearance: She is well-developed. She is not diaphoretic.  HENT:     Head:     Comments: Jaw: limitation to fully opening the jaw. No muscle TTP. No clicking or locking of the TMJ    Right Ear: External ear normal.     Left Ear: External ear normal.     Nose: Nose normal.  Eyes:     Conjunctiva/sclera: Conjunctivae normal.  Neck:     Musculoskeletal: Neck supple.  Cardiovascular:     Rate and Rhythm: Normal  rate.  Pulmonary:     Effort: Pulmonary effort is normal.  Musculoskeletal:     Comments: Right shoulder:  Inspection: no swelling or deformity noted Palpation: Mild TTP in the anterior shoulder joint ROM: decrease abduction and flexion - loss of 5-10 degrees compared to the left, which resolves with passive but has pain, normal internal and external rotation Strength: overall weakness noted 2/2 to pain  Special tests: + Neer and Hawkins-Kennedy, supraspinatus muscle weakness, pain with subscapularis strength testing  Skin:    General: Skin is warm and dry.     Capillary Refill: Capillary refill takes less than 2 seconds.  Neurological:     Mental Status: She is alert. Mental status is at baseline.  Psychiatric:        Mood and Affect: Mood normal.        Behavior: Behavior normal.           Assessment & Plan:   Problem List Items Addressed This Visit      Musculoskeletal and Integument   TMJ (temporomandibular  joint disorder)    Given hx the most likely cause of her symptoms. NSAIDs, jaw exercises. Also advised discussing with dentist at next visit.         Other   Impingement syndrome of right shoulder - Primary    Significant pain with several testing. At this point suspect impingement, however, could also be supraspinatus injury. Advised, rest NSAIDS, and work not provided. Declined PT today, but will call back not resolving quickly.           Return if symptoms worsen or fail to improve.  Lynnda ChildJessica R Cody, MD

## 2019-03-08 NOTE — Patient Instructions (Signed)
#  shoulder impingement - stop doing the job at work that is stressing the shoulder - take naproxen 2 times a day for the next 5-7 days - If your symptoms do not improve with rest from work and naproxen, call or MyChart and I will place referral for Physical Therapy  #Jaw Pain - likely Temporomandibular Joint Disorder - Can use naproxen as above - exercises as below - mention to your dentist at your next appointment Exercise A: Forward protrusion 1. Push your jaw forward. Hold this position for 1-2 seconds. 2. Allow your jaw to return to its normal position and rest it there for 1-2 seconds. Exercise B: Controlled opening 1. Stand or sit in front of a mirror. Place your tongue on the roof of your mouth, just behind your top teeth. 2. Keeping your tongue on the roof of your mouth, slowly open and close your mouth. 3. While you open and close your mouth, watch your jaw in the mirror. Try to keep your jaw from moving to one side or the other. Exercise C: Right and left motion 1. Move your jaw right. Hold this position for 1-2 seconds. Allow your jaw to return to its normal position, and rest it there for 1-2 seconds. 2. Move your jaw left. Hold this position for 1-2 seconds. Allow your jaw to return to its normal position, and rest it there for 1-2 seconds. Postural exercises Exercise A: Chin tucks 1. You can do this exercise sitting, standing, or lying down. 2. Move your head straight back, keeping your head level. You can guide the movement by placing your fingers on your chin to push your jaw back in an even motion. You should be able to feel a double chin form at the end of the motion. 3. Hold this position for 5 seconds. Repeat 10-15 times. Exercise B: Shoulder blade squeeze 1. Sit or stand. 2. Bend your elbows to about 90 degrees, which is the shape of a capital letter "L." Keep your upper arms by your body. 3. Squeeze your shoulder blades down and back, as though you were trying to  touch your elbows behind you. Do not shrug your shoulders or move your head. 4. Hold this position for 5 seconds. Repeat 10-15 times. Exercise C: Chest stretch 1. Stand facing a corner. 2. Put both of your hands and your forearms on the wall, with your arms wide apart. 3. Make sure your arms are at a 90-degree angle to your body. This means that you should hold your arms straight out from your body, level with the floor. 4. Step in toward the corner. Do not lean in. 5. Hold this position for 30 seconds. Repeat 3 times. Contact a health care provider if you have:  Jaw pain that is new or gets worse.  Clicking or popping sounds while doing the exercises. Get help right away if:  Your jaw is stuck in one place and you cannot move it.  You cannot open or close your mouth. This information is not intended to replace advice given to you by your health care provider. Make sure you discuss any questions you have with your health care provider. Document Released: 08/07/2008 Document Revised: 12/17/2018 Document Reviewed: 07/22/2017 Elsevier Patient Education  2020 [employer].

## 2019-11-17 ENCOUNTER — Encounter: Payer: Self-pay | Admitting: Obstetrics & Gynecology

## 2019-11-17 ENCOUNTER — Ambulatory Visit (INDEPENDENT_AMBULATORY_CARE_PROVIDER_SITE_OTHER): Payer: Commercial Managed Care - PPO | Admitting: Obstetrics & Gynecology

## 2019-11-17 ENCOUNTER — Other Ambulatory Visit: Payer: Self-pay

## 2019-11-17 VITALS — BP 111/73 | HR 76 | Wt 118.0 lb

## 2019-11-17 DIAGNOSIS — Z1151 Encounter for screening for human papillomavirus (HPV): Secondary | ICD-10-CM | POA: Diagnosis not present

## 2019-11-17 DIAGNOSIS — Z01419 Encounter for gynecological examination (general) (routine) without abnormal findings: Secondary | ICD-10-CM | POA: Diagnosis not present

## 2019-11-17 DIAGNOSIS — Z124 Encounter for screening for malignant neoplasm of cervix: Secondary | ICD-10-CM

## 2019-11-17 DIAGNOSIS — E559 Vitamin D deficiency, unspecified: Secondary | ICD-10-CM | POA: Diagnosis not present

## 2019-11-17 DIAGNOSIS — R87810 Cervical high risk human papillomavirus (HPV) DNA test positive: Secondary | ICD-10-CM | POA: Insufficient documentation

## 2019-11-17 NOTE — Progress Notes (Signed)
GYNECOLOGY ANNUAL PREVENTATIVE CARE ENCOUNTER NOTE  History:     Sarah Blevins is a 39 y.o. [id number] female here for a routine annual gynecologic exam.  Current complaints: none.   Denies abnormal vaginal bleeding, discharge, pelvic pain, problems with intercourse or other gynecologic concerns.    Gynecologic History Patient's last menstrual period was 11/09/2019 (exact date). Contraception: condoms Last Pap: 07/03/2017 . Results were: normal with negative HPV Last mammogram: 12/28 2018 (done for breast pain). Results were: normal  Obstetric History OB History  Gravida Para Term Preterm AB Living  2 2 2  0 0 2  SAB TAB Ectopic Multiple Live Births  0 0 0 0 2    # Outcome Date GA Lbr Len/2nd Weight Sex Delivery Anes PTL Lv  2 Term 06/15/14 [redacted]w[redacted]d  7 lb 1.2 oz (3.209 kg) M CS-LTranv Spinal  LIV  1 Term 09/17/12 [redacted]w[redacted]d 12:45 / 03:55 6 lb 12.6 oz (3.08 kg) F Vag-Spont EPI  LIV     Birth Comments: na    Past Medical History:  Diagnosis Date  . ASCUS with positive high risk HPV on pap 03/10/2012   F/u pap     Past Surgical History:  Procedure Laterality Date  . CESAREAN SECTION N/A 06/15/2014   Procedure: CESAREAN SECTION;  Surgeon: Lavonia Drafts, MD;  Location: [facility];  Service: Obstetrics;  Laterality: N/A;  . LEG SURGERY     right leg blood cyst removal.  . WISDOM TOOTH EXTRACTION     x2    Current Outpatient Medications on File Prior to Visit  Medication Sig Dispense Refill  . Biotin w/ Vitamins C & E (HAIR/SKIN/NAILS PO) Take by mouth.    . Multiple Vitamin (MULTIVITAMIN) tablet Take 1 tablet by mouth daily.     No current facility-administered medications on file prior to visit.    No Known Allergies  Social History:  reports that she has never smoked. She has never used smokeless tobacco. She reports that she does not drink alcohol or use drugs.  Family History  Problem Relation Age of Onset  . Anemia Mother   . Hypertension Father     The  following portions of the patient's history were reviewed and updated as appropriate: allergies, current medications, past family history, past medical history, past social history, past surgical history and problem list.  Review of Systems Pertinent items noted in HPI and remainder of comprehensive ROS otherwise negative.  Physical Exam:  BP 111/73   Pulse 76   Wt 118 lb (53.5 kg)   LMP 11/09/2019 (Exact Date)   BMI 20.90 kg/m  CONSTITUTIONAL: Well-developed, well-nourished female in no acute distress.  HENT:  Normocephalic, atraumatic, External right and left ear normal. Oropharynx is clear and moist EYES: Conjunctivae and EOM are normal. Pupils are equal, round, and reactive to light. No scleral icterus.  NECK: Normal range of motion, supple, no masses.  Normal thyroid.  SKIN: Skin is warm and dry. No rash noted. Not diaphoretic. No erythema. No pallor. MUSCULOSKELETAL: Normal range of motion. No tenderness.  No cyanosis, clubbing, or edema.  2+ distal pulses. NEUROLOGIC: Alert and oriented to person, place, and time. Normal reflexes, muscle tone coordination.  PSYCHIATRIC: Normal mood and affect. Normal behavior. Normal judgment and thought content. CARDIOVASCULAR: Normal heart rate noted, regular rhythm RESPIRATORY: Clear to auscultation bilaterally. Effort and breath sounds normal, no problems with respiration noted. BREASTS: Symmetric in size. No masses, tenderness, skin changes, nipple drainage, or lymphadenopathy bilaterally. ABDOMEN: Soft, no  distention noted.  No tenderness, rebound or guarding.  PELVIC: Normal appearing external genitalia and urethral meatus; normal appearing vaginal mucosa and cervix.  No abnormal discharge noted.  Pap smear obtained.  Normal uterine size, no other palpable masses, no uterine or adnexal tenderness.   Assessment and Plan:    1. Well woman exam with routine gynecological exam - Cytology - PAP - CBC; Future - TSH; Future - Hemoglobin A1c;  Future - Comprehensive metabolic panel; Future - Lipid panel; Future - VITAMIN D 25 Hydroxy (Vit-D Deficiency, Fractures); Future Will follow up results of pap smear and labs and manage accordingly. Mammogram screening to start at age 46.  Routine preventative health maintenance measures emphasized. Please refer to After Visit Summary for other counseling recommendations.      Jaynie Collins, MD, FACOG Obstetrician & Gynecologist, [facility] for [employer], [facility]

## 2019-11-17 NOTE — Patient Instructions (Signed)
Preventive Care 43-39 Years Old, Female Preventive care refers to visits with your health care provider and lifestyle choices that can promote health and wellness. This includes:  A yearly physical exam. This may also be called an annual well check.  Regular dental visits and eye exams.  Immunizations.  Screening for certain conditions.  Healthy lifestyle choices, such as eating a healthy diet, getting regular exercise, not using drugs or products that contain nicotine and tobacco, and limiting alcohol use. What can I expect for my preventive care visit? Physical exam Your health care provider will check your:  Height and weight. This may be used to calculate body mass index (BMI), which tells if you are at a healthy weight.  Heart rate and blood pressure.  Skin for abnormal spots. Counseling Your health care provider may ask you questions about your:  Alcohol, tobacco, and drug use.  Emotional well-being.  Home and relationship well-being.  Sexual activity.  Eating habits.  Work and work Statistician.  Method of birth control.  Menstrual cycle.  Pregnancy history. What immunizations do I need?  Influenza (flu) vaccine  This is recommended every year. Tetanus, diphtheria, and pertussis (Tdap) vaccine  You may need a Td booster every 10 years. Varicella (chickenpox) vaccine  You may need this if you have not been vaccinated. Human papillomavirus (HPV) vaccine  If recommended by your health care provider, you may need three doses over 6 months. Measles, mumps, and rubella (MMR) vaccine  You may need at least one dose of MMR. You may also need a second dose. Meningococcal conjugate (MenACWY) vaccine  One dose is recommended if you are age 44-21 years and a first-year college student living in a residence hall, or if you have one of several medical conditions. You may also need additional booster doses. Pneumococcal conjugate (PCV13) vaccine  You may need  this if you have certain conditions and were not previously vaccinated. Pneumococcal polysaccharide (PPSV23) vaccine  You may need one or two doses if you smoke cigarettes or if you have certain conditions. Hepatitis A vaccine  You may need this if you have certain conditions or if you travel or work in places where you may be exposed to hepatitis A. Hepatitis B vaccine  You may need this if you have certain conditions or if you travel or work in places where you may be exposed to hepatitis B. Haemophilus influenzae type b (Hib) vaccine  You may need this if you have certain conditions. You may receive vaccines as individual doses or as more than one vaccine together in one shot (combination vaccines). Talk with your health care provider about the risks and benefits of combination vaccines. What tests do I need?  Blood tests  Lipid and cholesterol levels. These may be checked every 5 years starting at age 100.  Hepatitis C test.  Hepatitis B test. Screening  Diabetes screening. This is done by checking your blood sugar (glucose) after you have not eaten for a while (fasting).  Sexually transmitted disease (STD) testing.  BRCA-related cancer screening. This may be done if you have a family history of breast, ovarian, tubal, or peritoneal cancers.  Pelvic exam and Pap test. This may be done every 3 years starting at age 82. Starting at age 12, this may be done every 5 years if you have a Pap test in combination with an HPV test. Talk with your health care provider about your test results, treatment options, and if necessary, the need for more tests.  Follow these instructions at home: Eating and drinking   Eat a diet that includes fresh fruits and vegetables, whole grains, lean protein, and low-fat dairy.  Take vitamin and mineral supplements as recommended by your health care provider.  Do not drink alcohol if: ? Your health care provider tells you not to drink. ? You are  pregnant, may be pregnant, or are planning to become pregnant.  If you drink alcohol: ? Limit how much you have to 0-1 drink a day. ? Be aware of how much alcohol is in your drink. In the [place] one drink equals one 12 oz bottle of beer (355 mL), one 5 oz glass of wine (148 mL), or one 1 oz glass of hard liquor (44 mL). Lifestyle  Take daily care of your teeth and gums.  Stay active. Exercise for at least 30 minutes on 5 or more days each week.  Do not use any products that contain nicotine or tobacco, such as cigarettes, e-cigarettes, and chewing tobacco. If you need help quitting, ask your health care provider.  If you are sexually active, practice safe sex. Use a condom or other form of birth control (contraception) in order to prevent pregnancy and STIs (sexually transmitted infections). If you plan to become pregnant, see your health care provider for a preconception visit. What's next?  Visit your health care provider once a year for a well check visit.  Ask your health care provider how often you should have your eyes and teeth checked.  Stay up to date on all vaccines. This information is not intended to replace advice given to you by your health care provider. Make sure you discuss any questions you have with your health care provider. Document Revised: 05/06/2018 Document Reviewed: 05/06/2018 Elsevier Patient Education  2020 [employer].

## 2019-11-18 ENCOUNTER — Other Ambulatory Visit: Payer: Commercial Managed Care - PPO

## 2019-11-18 ENCOUNTER — Other Ambulatory Visit: Payer: Self-pay

## 2019-11-18 DIAGNOSIS — Z01419 Encounter for gynecological examination (general) (routine) without abnormal findings: Secondary | ICD-10-CM

## 2019-11-19 LAB — COMPREHENSIVE METABOLIC PANEL
ALT: 13 IU/L (ref 0–32)
AST: 16 IU/L (ref 0–40)
Albumin/Globulin Ratio: 1.5 (ref 1.2–2.2)
Albumin: 4.4 g/dL (ref 3.8–4.8)
Alkaline Phosphatase: 46 IU/L (ref 39–117)
BUN/Creatinine Ratio: 17 (ref 9–23)
BUN: 14 mg/dL (ref 6–20)
Bilirubin Total: 0.2 mg/dL (ref 0.0–1.2)
CO2: 21 mmol/L (ref 20–29)
Calcium: 9.1 mg/dL (ref 8.7–10.2)
Chloride: 101 mmol/L (ref 96–106)
Creatinine, Ser: 0.82 mg/dL (ref 0.57–1.00)
GFR calc Af Amer: 105 mL/min/{1.73_m2} (ref >59)
GFR calc non Af Amer: 91 mL/min/{1.73_m2} (ref >59)
Globulin, Total: 3 g/dL (ref 1.5–4.5)
Glucose: 86 mg/dL (ref 65–99)
Potassium: 3.9 mmol/L (ref 3.5–5.2)
Sodium: 138 mmol/L (ref 134–144)
Total Protein: 7.4 g/dL (ref 6.0–8.5)

## 2019-11-19 LAB — CBC
Hematocrit: 39.9 % (ref 34.0–46.6)
Hemoglobin: 12.7 g/dL (ref 11.1–15.9)
MCH: 28.7 pg (ref 26.6–33.0)
MCHC: 31.8 g/dL (ref 31.5–35.7)
MCV: 90 fL (ref 79–97)
Platelets: 252 10*3/uL (ref 150–450)
RBC: 4.42 x10E6/uL (ref 3.77–5.28)
RDW: 11.7 % (ref 11.7–15.4)
WBC: 6.2 10*3/uL (ref 3.4–10.8)

## 2019-11-19 LAB — LIPID PANEL
Chol/HDL Ratio: 2.4 ratio (ref 0.0–4.4)
Cholesterol, Total: 158 mg/dL (ref 100–199)
HDL: 67 mg/dL (ref >39)
LDL Chol Calc (NIH): 67 mg/dL (ref 0–99)
Triglycerides: 141 mg/dL (ref 0–149)
VLDL Cholesterol Cal: 24 mg/dL (ref 5–40)

## 2019-11-19 LAB — TSH: TSH: 2.63 u[IU]/mL (ref 0.450–4.500)

## 2019-11-19 LAB — VITAMIN D 25 HYDROXY (VIT D DEFICIENCY, FRACTURES): Vit D, 25-Hydroxy: 18.1 ng/mL — ABNORMAL LOW (ref 30.0–100.0)

## 2019-11-19 LAB — HEMOGLOBIN A1C
Est. average glucose Bld gHb Est-mCnc: 88 mg/dL
Hgb A1c MFr Bld: 4.7 % — ABNORMAL LOW (ref 4.8–5.6)

## 2019-11-21 MED ORDER — CHOLECALCIFEROL 1.25 MG (50000 UT) PO CAPS: 50000.0000 [IU] | ORAL_CAPSULE | ORAL | 3 refills | Status: DC

## 2019-11-21 NOTE — Addendum Note (Signed)
Addended by: Jaynie Collins A on: 11/21/2019 09:42 AM   Modules accepted: Orders

## 2019-11-25 ENCOUNTER — Encounter: Payer: Self-pay | Admitting: Obstetrics & Gynecology

## 2019-11-25 LAB — CYTOLOGY - PAP
Comment: NEGATIVE
Comment: NEGATIVE
Diagnosis: NEGATIVE
HPV 16: NEGATIVE
HPV 18 / 45: POSITIVE — AB
High risk HPV: POSITIVE — AB

## 2019-11-28 ENCOUNTER — Telehealth: Payer: Self-pay | Admitting: *Deleted

## 2019-11-28 NOTE — Telephone Encounter (Signed)
Pt informed of Pap results and the need for a colposcopy. Pt verbalizes and understands.

## 2019-11-28 NOTE — Telephone Encounter (Signed)
-----   Message from Sarah Newcomer, MD sent at 11/25/2019  2:44 PM EDT ----- Please schedule patient for colposcopy for pap with positive HRHPV 18/45 done on 11/17/2019. Please call to inform patient of results and need for appointment.

## 2019-12-13 ENCOUNTER — Other Ambulatory Visit: Payer: Self-pay | Admitting: Obstetrics & Gynecology

## 2019-12-13 ENCOUNTER — Other Ambulatory Visit: Payer: Self-pay

## 2019-12-13 ENCOUNTER — Encounter: Payer: Self-pay | Admitting: Obstetrics & Gynecology

## 2019-12-13 ENCOUNTER — Ambulatory Visit (INDEPENDENT_AMBULATORY_CARE_PROVIDER_SITE_OTHER): Payer: Commercial Managed Care - PPO | Admitting: Obstetrics & Gynecology

## 2019-12-13 DIAGNOSIS — Z7185 Encounter for immunization safety counseling: Secondary | ICD-10-CM

## 2019-12-13 DIAGNOSIS — Z3202 Encounter for pregnancy test, result negative: Secondary | ICD-10-CM

## 2019-12-13 DIAGNOSIS — R87810 Cervical high risk human papillomavirus (HPV) DNA test positive: Secondary | ICD-10-CM | POA: Diagnosis not present

## 2019-12-13 DIAGNOSIS — Z7189 Other specified counseling: Secondary | ICD-10-CM

## 2019-12-13 LAB — POCT URINE PREGNANCY: Preg Test, Ur: NEGATIVE

## 2019-12-13 NOTE — Progress Notes (Signed)
    GYNECOLOGY OFFICE COLPOSCOPY PROCEDURE NOTE  39 y.o. [id number] here for colposcopy for positive HRHPV 18/45 with normal cytology pap smear on 11/17/2019. Discussed role for HPV in cervical dysplasia, need for surveillance. Counseled about HPV vaccine, she agrees to start this today.   Patient given informed consent, signed copy in the chart, time out was performed.  Placed in lithotomy position. Cervix viewed with speculum and colposcope after application of acetic acid.  It was somewhat difficult given vaginal tissue obstruction, had to use long Graves speculum.   Colposcopy adequate? Yes No visible lesions, ECC specimen obtained,labeled and sent to pathology.  Patient was given post procedure instructions.  Will follow up pathology and manage accordingly; patient will be contacted with results and recommendations.  First injection of Gardasil given today, will return in 2 and 6 months for subsequent doses.  Routine preventative health maintenance measures emphasized.   Jaynie Collins, MD, FACOG Obstetrician & Gynecologist, [facility] for [employer], [facility]

## 2019-12-13 NOTE — Patient Instructions (Addendum)
COLPOSCOPY POST-PROCEDURE INSTRUCTIONS  1. You may take Ibuprofen, Aleve or Tylenol for cramping if needed.  2. If Monsel's solution was used, you will have a black discharge.  3. Light bleeding is normal.  If bleeding is heavier than your period, please call.  4. Put nothing in your vagina until the bleeding or discharge stops (usually 2 or 3 days).  5. We will call you within one week with biopsy results or discuss the results at your follow-up appointment if needed.     HPV (Human Papillomavirus) Vaccine: What You Need to Know 1. Why get vaccinated? HPV (Human papillomavirus) vaccine can prevent infection with some types of human papillomavirus. HPV infections can cause certain types of cancers including:  cervical, vaginal and vulvar cancers in women,  penile cancer in men, and  anal cancers in both men and women. HPV vaccine prevents infection from the HPV types that cause over 90% of these cancers. HPV is spread through intimate skin-to-skin or sexual contact. HPV infections are so common that nearly all men and women will get at least one type of HPV at some time in their lives. Most HPV infections go away by themselves within 2 years. But sometimes HPV infections will last longer and can cause cancers later in life. 2. HPV vaccine HPV vaccine is routinely recommended for adolescents at 33 or 39 years of age to ensure they are protected before they are exposed to the virus. HPV vaccine may be given beginning at age 55 years, and as late as age 50 years. Most people older than 26 years will not benefit from HPV vaccination. Talk with your health care provider if you want more information. Most children who get the first dose before 53 years of age need 2 doses of HPV vaccine. Anyone who gets the first dose on or after 39 years of age, and younger people with certain immunocompromising conditions, need 3 doses. Your health care provider can give you more information. HPV vaccine  may be given at the same time as other vaccines. 3. Talk with your health care provider Tell your vaccine provider if the person getting the vaccine:  Has had an allergic reaction after a previous dose of HPV vaccine, or has any severe, life-threatening allergies.  Is pregnant. In some cases, your health care provider may decide to postpone HPV vaccination to a future visit. People with minor illnesses, such as a cold, may be vaccinated. People who are moderately or severely ill should usually wait until they recover before getting HPV vaccine. Your health care provider can give you more information. 4. Risks of a vaccine reaction  Soreness, redness, or swelling where the shot is given can happen after HPV vaccine.  Fever or headache can happen after HPV vaccine. People sometimes faint after medical procedures, including vaccination. Tell your provider if you feel dizzy or have vision changes or ringing in the ears. As with any medicine, there is a very remote chance of a vaccine causing a severe allergic reaction, other serious injury, or death. 5. What if there is a serious problem? An allergic reaction could occur after the vaccinated person leaves the clinic. If you see signs of a severe allergic reaction (hives, swelling of the face and throat, difficulty breathing, a fast heartbeat, dizziness, or weakness), call 9-1-1 and get the person to the nearest hospital. For other signs that concern you, call your health care provider. Adverse reactions should be reported to the Vaccine Adverse Event Reporting System (VAERS). Your  health care provider will usually file this report, or you can do it yourself. Visit the VAERS website at www.vaers.SamedayNews.es or call (860) 267-4052. VAERS is only for reporting reactions, and VAERS staff do not give medical advice. 6. The National Vaccine Injury Compensation Program The [employer] Vaccine Injury Compensation Program (VICP) is a federal program that was  created to compensate people who may have been injured by certain vaccines. Visit the VICP website at GoldCloset.com.ee or call (864) 324-4336 to learn about the program and about filing a claim. There is a time limit to file a claim for compensation. 7. How can I learn more?  Ask your health care provider.  Call your local or state health department.  Contact the Centers for Disease Control and Prevention (CDC): ? Call 304-286-3338 (1-800-CDC-INFO) or ? Visit CDC's website at http://hunter.com/ Vaccine Information Statement HPV Vaccine (07/07/2018) This information is not intended to replace advice given to you by your health care provider. Make sure you discuss any questions you have with your health care provider. Document Revised: 12/14/2018 Document Reviewed: 04/06/2018 Elsevier Patient Education  [place].    Human Papillomavirus Human papillomavirus (HPV) is the most common sexually transmitted infection (STI). It spreads easily from person to person (ishighly contagious). There are many types of HPV. It usually does not cause symptoms. However, sometimes it may cause wart-like lesions in the throat or warts in the genital area. It is possible to be infected for a long time and pass HPV to others without knowing it. Certain types of HPV may cause cancers, including cancer of the lower part of the uterus (cervix), vagina, outer female genital area (vulva), penis, anus, and rectum. HPV may also cause cancers of the oral cavity, such as the throat, tongue, and tonsils. What are the causes? HPV is caused by a virus that spreads from person to person through oral, vaginal, or anal sex. What increases the risk? You may be more likely to develop this condition if you have or have had:  Unprotected oral, vaginal, or anal sex.  Several sex partners.  A sex partner who has other sex partners.  Another STI.  A weak disease-fighting system (immune  system).  Damaged skin in the genital, oral, or anal area. What are the signs or symptoms? Most people who have HPV do not have any symptoms. If symptoms are present, they may include:  Wart-like lesions in the throat (from having oral sex).  Warts on the infected skin or mucous membranes.  Genital warts that may itch, burn, bleed, or be painful during sex. How is this diagnosed? If you have wart-like lumps in the anal area or throat, or if genital warts are present, your health care provider can usually diagnose HPV with a physical exam. Genital warts are easily seen. In females, tests may be used to diagnose HPV, including:  A Pap test. A Pap test takes a sample of cells from the cervix to check for cancer and HPV infection.  An HPV test. This is similar to a Pap test and involves taking a sample of cells from the cervix.  Using a scope to view the cervix (colposcopy). This may be done if a pelvic exam or Pap test is abnormal. A sample of tissue may be removed for testing (biopsy) during the colposcopy. Currently, there is no test to detect HPV in males. How is this treated? There is no treatment for the virus itself. However, there are treatments for the health problems and symptoms HPV can  cause. Treatment for HPV may include:  Medicines in a cream, lotion, liquid, or gel form. These medicines may be injected into or applied to genital or anal warts.  Use of a probe to apply extreme cold (cryotherapy) to the genital or anal warts.  Application of an intense beam of light (laser treatment) on the genital or anal warts.  Use of a probe to apply extreme heat (electrocautery) on the genital or anal warts.  Surgery to remove the genital or anal warts. Your health care provider will monitor you closely after you are treated. HPV can come back and you may need treatment again. Follow these instructions at home: Medicines  Take over-the-counter and prescription medicines only as told  by your health care provider. This include creams for itching or irritation.  Do not treat genital or anal warts with medicines used for treating hand warts. General instructions  Do not touch or scratch the warts.  Do not have sex while you are being treated.  Do not douche or use tampons during treatment (for women).  Tell your sex partner about your infection. He or she may also need to be treated.  If you become pregnant, tell your health care provider that you have HPV. Your health care provider will monitor you closely during pregnancy to make sure you and your baby are safe.  Keep all follow-up visits as told by your health care provider. This is important. How is this prevented?  Talk with your health care provider about getting an HPV vaccine, which can prevent some HPV infections and related cancers. It will not work if you already have HPV and is not recommended for pregnant women. You may need 2-3 doses of the vaccine, depending on your age.  After treatment, use condoms during sex to prevent future infections.  Have only one sex partner.  Have a sex partner who does not have other sex partners.  Get regular Pap tests as directed by your health care provider. Contact a health care provider if:  The treated skin becomes red, swollen, or painful.  You have a fever.  You feel generally ill.  You feel lumps or pimples in and around your genital or anal area.  You develop bleeding of the vagina or the treatment area.  You have painful sex. Summary  Human papillomavirus (HPV) is the most common sexually transmitted infection (STI) and is highly contagious.  Most people carrying HPV do not have any symptoms.  Many forms of HPV can be prevented with vaccination.  There is no treatment for the virus itself. However, there are treatments for the health problems and symptoms HPV can cause. This information is not intended to replace advice given to you by your health  care provider. Make sure you discuss any questions you have with your health care provider. Document Revised: 05/03/2019 Document Reviewed: 04/22/2018 Elsevier Patient Education  2020 [employer].

## 2019-12-14 NOTE — Progress Notes (Signed)
12/13/19 Colposcopy Pathology (after pap with +HPV 18/45, normal cytology) Endocervix, curettage - SCANT BENIGN SQUAMOUS EPITHELIUM. - BENIGN ENDOCERVICAL EPITHELIUM. This is reassuring.  Continue with HPV vaccine series.  Repeat pap and HPV test in one year.  Please call to inform patient of results and recommendations.  Results were also released to MyChart and patient was given recommendations as indicated.  Jaynie Collins, MD

## 2019-12-15 ENCOUNTER — Telehealth: Payer: Self-pay | Admitting: *Deleted

## 2019-12-15 NOTE — Telephone Encounter (Signed)
Called pt and informed her of her colpo results and recommendations from Dr Macon Large. Pt verbalizes and understands.

## 2019-12-15 NOTE — Telephone Encounter (Signed)
-----   Message from Tereso Newcomer, MD sent at 12/14/2019 11:50 AM EDT ----- 12/13/19 Colposcopy Pathology (after pap with +HPV 18/45, normal cytology) Endocervix, curettage - SCANT BENIGN SQUAMOUS EPITHELIUM. - BENIGN ENDOCERVICAL EPITHELIUM. This is reassuring.  Continue with HPV vaccine series.  Repeat pap and HPV test in one year.  Please call to inform patient of results and recommendations.  Results were also released to MyChart and patient was given recommendations as indicated.  Jaynie Collins, MD

## 2020-02-17 ENCOUNTER — Ambulatory Visit: Payer: Commercial Managed Care - PPO

## 2020-03-20 ENCOUNTER — Other Ambulatory Visit: Payer: Self-pay

## 2020-03-20 ENCOUNTER — Telehealth: Payer: Self-pay | Admitting: Internal Medicine

## 2020-03-20 ENCOUNTER — Ambulatory Visit: Payer: Commercial Managed Care - PPO | Admitting: Family Medicine

## 2020-03-20 VITALS — BP 88/60 | HR 67 | Temp 98.0°F | Wt 119.0 lb

## 2020-03-20 DIAGNOSIS — M25521 Pain in right elbow: Secondary | ICD-10-CM | POA: Insufficient documentation

## 2020-03-20 DIAGNOSIS — M25511 Pain in right shoulder: Secondary | ICD-10-CM | POA: Insufficient documentation

## 2020-03-20 DIAGNOSIS — M7711 Lateral epicondylitis, right elbow: Secondary | ICD-10-CM | POA: Diagnosis not present

## 2020-03-20 NOTE — Telephone Encounter (Signed)
That is OK with me. Though she may need an additional transfer of care appointment

## 2020-03-20 NOTE — Telephone Encounter (Signed)
Patient's husband called in requesting patient transfer care from [facility] to Dr.Cody. Acute visit for arm pain was scheduled with Dr.Cody, as per patient's request. Please advise is transfer is okay.
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
## 2020-03-20 NOTE — Telephone Encounter (Signed)
Fine with me, have not seen her since 2019

## 2020-03-20 NOTE — Telephone Encounter (Signed)
Patient scheduled for TOC with Dr.Cody.

## 2020-03-20 NOTE — Assessment & Plan Note (Signed)
Seen last year for right shoulder pain and present again. Unclear if is this is acute on chronic. She resists thorough exam 2/2 to pain. NSAIDs and PT if not improving in 1-2 weeks

## 2020-03-20 NOTE — Patient Instructions (Addendum)
500 mg of aleve twice daily for 3 days Then go to as needed for pain  Can also try Voltaren Gel which is over the counter topical treatment   Start wearing a brace for the elbow   If not returning normal grip strength, elbow movement and shoulder movement within 7-10 days then call and I will place referral for physical therapy  These may be more consistent with over-use injuries related to work

## 2020-03-23 ENCOUNTER — Ambulatory Visit: Payer: Commercial Managed Care - PPO | Admitting: Family Medicine

## 2020-03-26 ENCOUNTER — Encounter: Payer: Self-pay | Admitting: Family Medicine

## 2020-03-26 ENCOUNTER — Other Ambulatory Visit: Payer: Self-pay

## 2020-03-26 ENCOUNTER — Ambulatory Visit (INDEPENDENT_AMBULATORY_CARE_PROVIDER_SITE_OTHER)
Admission: RE | Admit: 2020-03-26 | Discharge: 2020-03-26 | Disposition: A | Discharge status: Home / Self Care | Payer: Commercial Managed Care - PPO | Source: Ambulatory Visit | Attending: Family Medicine | Admitting: Family Medicine

## 2020-03-26 ENCOUNTER — Ambulatory Visit: Payer: Commercial Managed Care - PPO | Admitting: Family Medicine

## 2020-03-26 VITALS — BP 108/64 | HR 71 | Temp 97.6°F | Ht 63.0 in | Wt 119.3 lb

## 2020-03-26 DIAGNOSIS — M25521 Pain in right elbow: Secondary | ICD-10-CM
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
## 2020-03-26 NOTE — Patient Instructions (Addendum)
Xray today I do think you have tennis elbow.  Continue chopat strap as discussed, continue voltaren gel, aleve, limit repetitive movements.   Do exercises provided today, we will also refer you for physical therapy. Light duty for 2 weeks, if unavailable, rec out of work for 2 wks. Return in 2 weeks for follow up visit with me or PCP.

## 2020-03-28 ENCOUNTER — Telehealth: Payer: Self-pay

## 2020-03-28 NOTE — Telephone Encounter (Signed)
I don't recommend heavy lifting >10 lbs, or repetitive hand or arm use for at least the next 1 week for recovery of tennis elbow.  If work entails above and no light duty is available I recommend she stay out of work for 1 week, possibly 2 while elbow heals.  Have her request FMLA for out of work for this time period - she can fax to our office and I will fill out.

## 2020-03-28 NOTE — Telephone Encounter (Signed)
Patient was seen by Dr. Reece Agar for elbow pain on 03/26/20. She states she was given a work note for light duty, no heavy lifting, etc, but her employer is still making her complete her usual tasks. She did take this letter to HR also, but they too disregarded the letter. She is wondering what more she can do?

## 2020-03-28 NOTE — Telephone Encounter (Signed)
Lvm asking pt to call back.  Need to relay Dr. G's message.  

## 2020-03-29 NOTE — Telephone Encounter (Signed)
Lvm asking pt to call back.  Need to relay Dr. G's message.  

## 2020-03-30 NOTE — Telephone Encounter (Signed)
Lvm asking pt to call back.  Need to relay Dr. Timoteo Expose message.  Mailing a letter due to no response.

## 2020-04-03 ENCOUNTER — Ambulatory Visit: Payer: Commercial Managed Care - PPO | Admitting: Family Medicine

## 2020-04-03 ENCOUNTER — Encounter: Payer: Self-pay | Admitting: Family Medicine

## 2020-04-03 ENCOUNTER — Other Ambulatory Visit: Payer: Self-pay

## 2020-04-03 VITALS — BP 113/79 | HR 73 | Temp 98.2°F | Wt 119.5 lb

## 2020-04-03 DIAGNOSIS — M25521 Pain in right elbow: Secondary | ICD-10-CM

## 2020-04-03 DIAGNOSIS — M25511 Pain in right shoulder: Secondary | ICD-10-CM

## 2020-04-03 NOTE — Progress Notes (Signed)
Subjective:     Sarah Blevins is a 39 y.o. female presenting for Arm Pain (Right x 3 weeks (numbness and discoloration) )     HPI   #Arm pain - right side - has continued to do her job - has been using her left hand instead of right - now she cannot do her job normally - went to the office and discussed the doctors note - now numbness - her arm was also becoming purple - spoke with supervisor about the light duty - last night she was taken out of work - has to complete FMLA paperwork - planning to complete for 2 weeks from yesterday  Numbness - started last week - but ignored this - symptoms getting worse  Review of Systems   Social History   Tobacco Use  Smoking Status Never Smoker  Smokeless Tobacco Never Used        Objective:    BP Readings from Last 3 Encounters:  04/03/20 113/79  03/26/20 108/64  03/20/20 (!) 88/60   Wt Readings from Last 3 Encounters:  04/03/20 119 lb 8 oz (54.2 kg)  03/26/20 119 lb 5 oz (54.1 kg)  03/20/20 119 lb (54 kg)    BP 113/79   Pulse 73   Temp 98.2 F (36.8 C) (Temporal)   Wt 119 lb 8 oz (54.2 kg)   LMP 03/14/2020   SpO2 99%   BMI 21.17 kg/m    Physical Exam Constitutional:      General: She is not in acute distress.    Appearance: She is well-developed. She is not diaphoretic.  HENT:     Right Ear: External ear normal.     Left Ear: External ear normal.     Nose: Nose normal.  Eyes:     Conjunctiva/sclera: Conjunctivae normal.  Cardiovascular:     Rate and Rhythm: Normal rate.  Pulmonary:     Effort: Pulmonary effort is normal.  Musculoskeletal:     Cervical back: Neck supple.     Comments: Still unable to lift her right should beyond 110-120 degrees without pain. Weakness with rotator cuff testing and signs of impingement though with discomfort difficult to localize injury.  Neg Spurling's bilaterall Positive tinel on the right elbow  Skin:    General: Skin is warm and dry.     Capillary  Refill: Capillary refill takes less than 2 seconds.  Neurological:     Mental Status: She is alert. Mental status is at baseline.  Psychiatric:        Mood and Affect: Mood normal.        Behavior: Behavior normal.           Assessment & Plan:   Problem List Items Addressed This Visit      Other   Right shoulder pain - Primary    Pt notes numbness but unclear if nerve compression is higher given shoulder pain. Acute on chronic findings and likely overuse related. She never saw PT for this and wonder if compensating for shoulder pain has lead to some elbow injury. PT referral for shoulder pain given same side so that both are treated.       Relevant Orders   Ambulatory referral to Physical Therapy   Right elbow pain    Continued to work after being told to rest and came off work yesterday. She fill have FMLA paperwork faxed. She is seeing PT on Thursday. Continue aleve for pain control. Cont plan to stay out  of work for 2 weeks. Given numbness/discoloration hx may consider ortho or sports medicine referral if symptoms not improving.           Return in about 2 weeks (around 04/17/2020).  Lynnda Child, MD  This visit occurred during the SARS-CoV-2 public health emergency.  Safety protocols were in place, including screening questions prior to the visit, additional usage of staff PPE, and extensive cleaning of exam room while observing appropriate contact time as indicated for disinfecting solutions.

## 2020-04-03 NOTE — Assessment & Plan Note (Addendum)
Continued to work after being told to rest and came off work yesterday. She fill have FMLA paperwork faxed. She is seeing PT on Thursday. Continue aleve for pain control. Cont plan to stay out of work for 2 weeks. Given numbness/discoloration hx may consider ortho or sports medicine referral if symptoms not improving.

## 2020-04-03 NOTE — Patient Instructions (Signed)
Fax number: [phone]  Send FMLA paperwork to Gweneth Dimitri, MD  Plan for return visit in 2 weeks - with either myself (if feeling better) or Dr. Patsy Lager (if still having lots of symptoms)

## 2020-04-03 NOTE — Assessment & Plan Note (Signed)
Pt notes numbness but unclear if nerve compression is higher given shoulder pain. Acute on chronic findings and likely overuse related. She never saw PT for this and wonder if compensating for shoulder pain has lead to some elbow injury. PT referral for shoulder pain given same side so that both are treated.

## 2020-04-04 ENCOUNTER — Telehealth: Payer: Self-pay | Admitting: Family Medicine

## 2020-04-04 NOTE — Telephone Encounter (Signed)
Please give paperwork to Dr. Reece Agar to complete since I am out of the office.

## 2020-04-04 NOTE — Telephone Encounter (Signed)
Done

## 2020-04-04 NOTE — Telephone Encounter (Signed)
Fmla paperwork in dr cody's in box  °For review and signature °

## 2020-04-05 NOTE — Telephone Encounter (Signed)
Signed and returned to [facility] coordinator

## 2020-05-08 ENCOUNTER — Encounter: Payer: Commercial Managed Care - PPO | Admitting: Family Medicine

## 2020-05-08 DIAGNOSIS — Z0289 Encounter for other administrative examinations: Secondary | ICD-10-CM

## 2020-06-15 ENCOUNTER — Ambulatory Visit: Payer: Commercial Managed Care - PPO

## 2020-07-09 ENCOUNTER — Telehealth (INDEPENDENT_AMBULATORY_CARE_PROVIDER_SITE_OTHER): Payer: Commercial Managed Care - PPO | Admitting: Family Medicine

## 2020-07-09 ENCOUNTER — Encounter: Payer: Self-pay | Admitting: Family Medicine

## 2020-07-09 DIAGNOSIS — J069 Acute upper respiratory infection, unspecified: Secondary | ICD-10-CM | POA: Diagnosis not present

## 2020-07-09 NOTE — Progress Notes (Signed)
Virtual Visit via Video Note  I connected with Sarah Blevins on 07/09/20 at  4:00 PM EDT by a video enabled telemedicine application and verified that I am speaking with the correct person using two identifiers.  Location: Patient: home Provider: office    I discussed the limitations of evaluation and management by telemedicine and the availability of in person appointments. The patient expressed understanding and agreed to proceed.  Parties involved in encounter  Patient:Sarah Blevins  Provider:  Roxy Manns MD    History of Present Illness: 39 yo pt of Dr Selena Batten present with uri symptoms   Symptoms started on Thursday  Dry throat  pnd  Headache -top and back of head Fever Friday/sat -- medicine helped    (no longer had fever) it was 102  Runny nose -yellow d/c  When she coughs her chest hurts (dry cough)    had neg covid test PCR on Thursday   Lost taste/smell since Saturday   No GI symptoms at all   Has occ sick contacts at work   She is fully vaccinated also   Has not had a flu shot   Taking mucinex and aleve for cold and flu   One of her kids has watery eyes but no other symptoms   Patient Active Problem List   Diagnosis Date Noted  . Right shoulder pain 03/20/2020  . Right elbow pain 03/20/2020  . Cervical high risk human papillomavirus (HPV) DNA test positive 11/17/2019  . Impingement syndrome of right shoulder 03/08/2019  . TMJ (temporomandibular joint disorder) 03/08/2019  . Vitamin D deficiency 07/03/2017   Past Medical History:  Diagnosis Date  . ASCUS with positive high risk HPV on pap 03/10/2012   F/u pap    Past Surgical History:  Procedure Laterality Date  . CESAREAN SECTION N/A 06/15/2014   Procedure: CESAREAN SECTION;  Surgeon: Willodean Rosenthal, MD;  Location: [facility];  Service: Obstetrics;  Laterality: N/A;  . LEG SURGERY     right leg blood cyst removal.  . WISDOM TOOTH EXTRACTION     x2   Social History   Tobacco Use  .  Smoking status: Never Smoker  . Smokeless tobacco: Never Used  Vaping Use  . Vaping Use: Never used  Substance Use Topics  . Alcohol use: No  . Drug use: No   Family History  Problem Relation Age of Onset  . Anemia Mother   . Hypertension Father    No Known Allergies Current Outpatient Medications on File Prior to Visit  Medication Sig Dispense Refill  . Multiple Vitamin (MULTIVITAMIN) tablet Take 1 tablet by mouth daily.    . naproxen sodium (ALEVE) 220 MG tablet Take 220 mg by mouth as needed.     No current facility-administered medications on file prior to visit.   Review of Systems  Constitutional: Positive for fever and malaise/fatigue. Negative for chills.  HENT: Positive for congestion. Negative for ear pain, sinus pain and sore throat.   Eyes: Negative for blurred vision, discharge and redness.  Respiratory: Positive for cough. Negative for sputum production, shortness of breath, wheezing and stridor.   Cardiovascular: Negative for chest pain, palpitations and leg swelling.  Gastrointestinal: Negative for abdominal pain, diarrhea, nausea and vomiting.  Musculoskeletal: Negative for myalgias.  Skin: Negative for rash.  Neurological: Positive for dizziness. Negative for headaches.     Observations/Objective: Patient appears well, in no distress, but seems fatigued  Weight is baseline  No facial swelling or asymmetry Normal voice-not  hoarse and no slurred speech No obvious tremor or mobility impairment Moving neck and UEs normally Able to hear the call well  No cough or shortness of breath during interview  Sounds congested (sniffling)  Talkative and mentally sharp with no cognitive changes No skin changes on face or neck , no rash or pallor Affect is normal    Assessment and Plan: Problem List Items Addressed This Visit      Respiratory   Viral URI with cough    Pt has cough/fever and loss of taste and smell  Had covid test Thursday (early in course) and  that was neg  Plans to get re tested at work tomorrow and she will call us with results  Enc her to drink fluids and rest  mucinex and aleve are ok if they help symptoms  inst her to call us if symptoms worsen or new symptoms Continue to isolate until covid result  May want to consider flu test (however out of the window for anti viral tx at this time)           Follow Up Instructions: Go ahead and get tested for covid again tomorrow at work and call us with a result  Stay home until you feel better   Drink fluids Rest  mucinex and aleve are ok for your symptoms  Tylenol can also be helpful   Please let us know if you develop worse cough or any wheezing or shortness of breath    I discussed the assessment and treatment plan with the patient. The patient was provided an opportunity to ask questions and all were answered. The patient agreed with the plan and demonstrated an understanding of the instructions.   The patient was advised to call back or seek an in-person evaluation if the symptoms worsen or if the condition fails to improve as anticipated.     Roxy Manns, MD

## 2020-07-09 NOTE — Patient Instructions (Signed)
Go ahead and get tested for covid again tomorrow at work and call us with a result  Stay home until you feel better   Drink fluids Rest  mucinex and aleve are ok for your symptoms  Tylenol can also be helpful   Please let us know if you develop worse cough or any wheezing or shortness of breath

## 2020-07-09 NOTE — Assessment & Plan Note (Addendum)
Pt has cough/fever and loss of taste and smell  Had covid test Thursday (early in course) and that was neg  Plans to get re tested at work tomorrow and she will call us with results  Enc her to drink fluids and rest  mucinex and aleve are ok if they help symptoms  inst her to call us if symptoms worsen or new symptoms Continue to isolate until covid result  May want to consider flu test (however out of the window for anti viral tx at this time)

## 2020-09-25 ENCOUNTER — Encounter: Payer: Self-pay | Admitting: Radiology

## 2021-01-22 ENCOUNTER — Other Ambulatory Visit: Payer: Self-pay

## 2021-01-22 ENCOUNTER — Ambulatory Visit (INDEPENDENT_AMBULATORY_CARE_PROVIDER_SITE_OTHER): Payer: Commercial Managed Care - PPO | Admitting: Obstetrics & Gynecology

## 2021-01-22 ENCOUNTER — Other Ambulatory Visit (HOSPITAL_COMMUNITY)
Admission: RE | Admit: 2021-01-22 | Discharge: 2021-01-22 | Disposition: A | Discharge status: Home / Self Care | Payer: No Typology Code available for payment source | Source: Ambulatory Visit | Attending: Obstetrics & Gynecology | Admitting: Obstetrics & Gynecology

## 2021-01-22 ENCOUNTER — Encounter: Payer: Self-pay | Admitting: Obstetrics & Gynecology

## 2021-01-22 VITALS — BP 103/70 | HR 85 | Ht 63.0 in | Wt 129.0 lb

## 2021-01-22 DIAGNOSIS — R87810 Cervical high risk human papillomavirus (HPV) DNA test positive: Secondary | ICD-10-CM | POA: Insufficient documentation

## 2021-01-22 DIAGNOSIS — Z01419 Encounter for gynecological examination (general) (routine) without abnormal findings: Secondary | ICD-10-CM | POA: Diagnosis not present

## 2021-01-22 NOTE — Progress Notes (Signed)
GYNECOLOGY ANNUAL PREVENTATIVE CARE ENCOUNTER NOTE  History:     Sarah Blevins is a 40 y.o. [id number] female here for a routine annual gynecologic exam.  Current complaints: none.   Denies abnormal vaginal bleeding, discharge, pelvic pain, problems with intercourse or other gynecologic concerns.    Gynecologic History Patient's last menstrual period was 01/18/2021 (exact date). Contraception: condoms Last Pap: 11/17/2019. Results were: normal with positive HPV 18/45. Benign colposcopy on 12/13/19. Received HPV vaccine dose on 12/13/19, did not come in for subsequent doses.  Obstetric History OB History  Gravida Para Term Preterm AB Living  2 2 2  0 0 2  SAB IAB Ectopic Multiple Live Births  0 0 0 0 2    # Outcome Date GA Lbr Len/2nd Weight Sex Delivery Anes PTL Lv  2 Term 06/15/14 [redacted]w[redacted]d  7 lb 1.2 oz (3.209 kg) M CS-LTranv Spinal  LIV  1 Term 09/17/12 [redacted]w[redacted]d 12:45 / 03:55 6 lb 12.6 oz (3.08 kg) F Vag-Spont EPI  LIV     Birth Comments: na    Past Medical History:  Diagnosis Date  . ASCUS with positive high risk HPV on pap 03/10/2012   F/u pap     Past Surgical History:  Procedure Laterality Date  . CESAREAN SECTION N/A 06/15/2014   Procedure: CESAREAN SECTION;  Surgeon: 08/15/2014, MD;  Location: [facility];  Service: Obstetrics;  Laterality: N/A;  . LEG SURGERY     right leg blood cyst removal.  . WISDOM TOOTH EXTRACTION     x2    Current Outpatient Medications on File Prior to Visit  Medication Sig Dispense Refill  . Multiple Vitamin (MULTIVITAMIN) tablet Take 1 tablet by mouth daily.    . naproxen sodium (ALEVE) 220 MG tablet Take 220 mg by mouth as needed.     No current facility-administered medications on file prior to visit.    No Known Allergies  Social History:  reports that she has never smoked. She has never used smokeless tobacco. She reports that she does not drink alcohol and does not use drugs.  Family History  Problem Relation Age of Onset  .  Anemia Mother   . Hypertension Father     The following portions of the patient's history were reviewed and updated as appropriate: allergies, current medications, past family history, past medical history, past social history, past surgical history and problem list.  Review of Systems Pertinent items noted in HPI and remainder of comprehensive ROS otherwise negative.  Physical Exam:  BP 103/70   Pulse 85   Ht 5\' 3"  (1.6 m)   Wt 129 lb (58.5 kg)   LMP 01/18/2021 (Exact Date)   BMI 22.85 kg/m  CONSTITUTIONAL: Well-developed, well-nourished female in no acute distress.  HENT:  Normocephalic, atraumatic, External right and left ear normal.  EYES: Conjunctivae and EOM are normal. Pupils are equal, round, and reactive to light. No scleral icterus.  NECK: Normal range of motion, supple, no masses.  Normal thyroid.  SKIN: Skin is warm and dry. No rash noted. Not diaphoretic. No erythema. No pallor. MUSCULOSKELETAL: Normal range of motion. No tenderness.  No cyanosis, clubbing, or edema. NEUROLOGIC: Alert and oriented to person, place, and time. Normal reflexes, muscle tone coordination.  PSYCHIATRIC: Normal mood and affect. Normal behavior. Normal judgment and thought content. CARDIOVASCULAR: Normal heart rate noted, regular rhythm RESPIRATORY: Clear to auscultation bilaterally. Effort and breath sounds normal, no problems with respiration noted. BREASTS: Symmetric in size. No masses, tenderness, skin  changes, nipple drainage, or lymphadenopathy bilaterally. Performed in the presence of a chaperone. ABDOMEN: Soft, no distention noted.  No tenderness, rebound or guarding.  PELVIC: Normal appearing external genitalia and urethral meatus; normal appearing vaginal mucosa and cervix.  No abnormal discharge noted.  Pap smear obtained.  Normal uterine size, no other palpable masses, no uterine or adnexal tenderness.  Performed in the presence of a chaperone.   Assessment and Plan:      1. Cervical  high risk human papillomavirus (HPV) DNA test positive 2. Well woman exam with routine gynecological exam - Cytology - PAP Will follow up results of pap smear and labs and manage accordingly. Patient declined to restart HPV vaccine series.  Mammogram screening to start at age 64.  Routine preventative health maintenance measures emphasized. Please refer to After Visit Summary for other counseling recommendations.      Jaynie Collins, MD, FACOG Obstetrician & Gynecologist, [facility] for [employer], [facility]

## 2021-01-22 NOTE — Patient Instructions (Signed)
You will need mammogram next year   Preventive Care 26-40 Years Old, Female Preventive care refers to lifestyle choices and visits with your health care provider that can promote health and wellness. This includes:  A yearly physical exam. This is also called an annual wellness visit.  Regular dental and eye exams.  Immunizations.  Screening for certain conditions.  Healthy lifestyle choices, such as: ? Eating a healthy diet. ? Getting regular exercise. ? Not using drugs or products that contain nicotine and tobacco. ? Limiting alcohol use. What can I expect for my preventive care visit? Physical exam Your health care provider will check your:  Height and weight. These may be used to calculate your BMI (body mass index). BMI is a measurement that tells if you are at a healthy weight.  Heart rate and blood pressure.  Body temperature.  Skin for abnormal spots. Counseling Your health care provider may ask you questions about your:  Past medical problems.  Family's medical history.  Alcohol, tobacco, and drug use.  Emotional well-being.  Home life and relationship well-being.  Sexual activity.  Diet, exercise, and sleep habits.  Work and work Statistician.  Access to firearms.  Method of birth control.  Menstrual cycle.  Pregnancy history. What immunizations do I need? Vaccines are usually given at various ages, according to a schedule. Your health care provider will recommend vaccines for you based on your age, medical history, and lifestyle or other factors, such as travel or where you work.   What tests do I need? Blood tests  Lipid and cholesterol levels. These may be checked every 5 years, or more often if you are over 40 years old.  Hepatitis C test.  Hepatitis B test. Screening  Lung cancer screening. You may have this screening every year starting at age 40 if you have a 30-pack-year history of smoking and currently smoke or have quit within the  past 15 years.  Colorectal cancer screening. ? All adults should have this screening starting at age 40 and continuing until age 33. ? Your health care provider may recommend screening at age 40 if you are at increased risk. ? You will have tests every 1-10 years, depending on your results and the type of screening test.  Diabetes screening. ? This is done by checking your blood sugar (glucose) after you have not eaten for a while (fasting). ? You may have this done every 1-3 years.  Mammogram. ? This may be done every 1-2 years. ? Talk with your health care provider about when you should start having regular mammograms. This may depend on whether you have a family history of breast cancer.  BRCA-related cancer screening. This may be done if you have a family history of breast, ovarian, tubal, or peritoneal cancers.  Pelvic exam and Pap test. ? This may be done every 3 years starting at age 50. ? Starting at age 74, this may be done every 5 years if you have a Pap test in combination with an HPV test. Other tests  STD (sexually transmitted disease) testing, if you are at risk.  Bone density scan. This is done to screen for osteoporosis. You may have this scan if you are at high risk for osteoporosis. Talk with your health care provider about your test results, treatment options, and if necessary, the need for more tests. Follow these instructions at home: Eating and drinking  Eat a diet that includes fresh fruits and vegetables, whole grains, lean protein, and low-fat  dairy products.  Take vitamin and mineral supplements as recommended by your health care provider.  Do not drink alcohol if: ? Your health care provider tells you not to drink. ? You are pregnant, may be pregnant, or are planning to become pregnant.  If you drink alcohol: ? Limit how much you have to 0-1 drink a day. ? Be aware of how much alcohol is in your drink. In the [place] one drink equals one 12 oz bottle of  beer (355 mL), one 5 oz glass of wine (148 mL), or one 1 oz glass of hard liquor (44 mL).   Lifestyle  Take daily care of your teeth and gums. Brush your teeth every morning and night with fluoride toothpaste. Floss one time each day.  Stay active. Exercise for at least 30 minutes 5 or more days each week.  Do not use any products that contain nicotine or tobacco, such as cigarettes, e-cigarettes, and chewing tobacco. If you need help quitting, ask your health care provider.  Do not use drugs.  If you are sexually active, practice safe sex. Use a condom or other form of protection to prevent STIs (sexually transmitted infections).  If you do not wish to become pregnant, use a form of birth control. If you plan to become pregnant, see your health care provider for a prepregnancy visit.  If told by your health care provider, take low-dose aspirin daily starting at age 55.  Find healthy ways to cope with stress, such as: ? Meditation, yoga, or listening to music. ? Journaling. ? Talking to a trusted person. ? Spending time with friends and family. Safety  Always wear your seat belt while driving or riding in a vehicle.  Do not drive: ? If you have been drinking alcohol. Do not ride with someone who has been drinking. ? When you are tired or distracted. ? While texting.  Wear a helmet and other protective equipment during sports activities.  If you have firearms in your house, make sure you follow all gun safety procedures. What's next?  Visit your health care provider once a year for an annual wellness visit.  Ask your health care provider how often you should have your eyes and teeth checked.  Stay up to date on all vaccines. This information is not intended to replace advice given to you by your health care provider. Make sure you discuss any questions you have with your health care provider. Document Revised: 05/29/2020 Document Reviewed: 05/06/2018 Elsevier Patient  Education  2021 [employer].

## 2021-01-28 LAB — CYTOLOGY - PAP
Comment: NEGATIVE
Diagnosis: NEGATIVE
High risk HPV: NEGATIVE

## 2021-09-18 ENCOUNTER — Other Ambulatory Visit: Payer: Self-pay

## 2021-09-18 ENCOUNTER — Ambulatory Visit (INDEPENDENT_AMBULATORY_CARE_PROVIDER_SITE_OTHER): Payer: No Typology Code available for payment source

## 2021-09-18 DIAGNOSIS — Z111 Encounter for screening for respiratory tuberculosis: Secondary | ICD-10-CM | POA: Diagnosis not present

## 2021-09-18 NOTE — Progress Notes (Signed)
PPD Placement note Sarah Blevins, 41 y.o. female is here today for placement of PPD test Reason for PPD test: school Pt taken PPD test before: yes Verified in allergy area and with patient that they are not allergic to the products PPD is made of (Phenol or Tween). Yes Is patient taking any oral or IV steroid medication now or have they taken it in the last month?  Has the patient ever received the BCG vaccine?: no Has the patient been in recent contact with anyone known or suspected of having active TB disease?: no      Date of exposure (if applicable): n/a      Name of person they were exposed to (if applicable): n/a Patient's Country of origin?: [place] O: Alert and oriented in NAD. P:  PPD placed on 09/18/2021.  Patient advised to return for reading within 48-72 hours.

## 2021-09-20 LAB — TB SKIN TEST
Induration: 0 mm
TB Skin Test: NEGATIVE

## 2022-09-10 ENCOUNTER — Ambulatory Visit (INDEPENDENT_AMBULATORY_CARE_PROVIDER_SITE_OTHER): Payer: BC Managed Care – PPO | Admitting: Family Medicine

## 2022-09-10 ENCOUNTER — Encounter: Payer: Self-pay | Admitting: Family Medicine

## 2022-09-10 ENCOUNTER — Other Ambulatory Visit (HOSPITAL_COMMUNITY)
Admission: RE | Admit: 2022-09-10 | Discharge: 2022-09-10 | Disposition: A | Discharge status: Home / Self Care | Payer: BC Managed Care – PPO | Source: Ambulatory Visit | Attending: Family Medicine | Admitting: Family Medicine

## 2022-09-10 VITALS — BP 99/66 | HR 84 | Wt 132.0 lb

## 2022-09-10 DIAGNOSIS — Z01419 Encounter for gynecological examination (general) (routine) without abnormal findings: Secondary | ICD-10-CM

## 2022-09-10 DIAGNOSIS — Z1231 Encounter for screening mammogram for malignant neoplasm of breast: Secondary | ICD-10-CM

## 2022-09-10 DIAGNOSIS — Z124 Encounter for screening for malignant neoplasm of cervix: Secondary | ICD-10-CM

## 2022-09-10 DIAGNOSIS — E559 Vitamin D deficiency, unspecified: Secondary | ICD-10-CM | POA: Diagnosis not present

## 2022-09-10 DIAGNOSIS — F322 Major depressive disorder, single episode, severe without psychotic features: Secondary | ICD-10-CM | POA: Diagnosis not present

## 2022-09-10 MED ORDER — ESCITALOPRAM OXALATE 10 MG PO TABS: 10.0000 mg | ORAL_TABLET | Freq: Every day | ORAL | 1 refills | Status: DC

## 2022-09-10 NOTE — Progress Notes (Signed)
Patient presents for Annual.  LMP:08/31/22 Monthly with Moderate flow Last pap: 01/22/2021 pt wants pap.  Contraception:None Mammogram:Never STD Screening: Declines  Flu Vaccine : Declines  CC: None   Fun Fact: Patient loves singing

## 2022-09-10 NOTE — Assessment & Plan Note (Signed)
99214 - Contracts for safety, IBH referral for possible therapy, begin Lexapro. Usual side effect profile, onset of action, dosing, regain of energy discussed. Patient will return in 4 weeks for f/u to discuss meds, impact on mental health and dosing.

## 2022-09-10 NOTE — Patient Instructions (Signed)

## 2022-09-10 NOTE — Progress Notes (Signed)
Subjective:     Sarah Blevins is a 42 y.o. female and is here for a comprehensive physical exam. The patient reports problems - poor sleep . Having mood swings and has trouble sleeping. Menstrual cycles are normal. Feels sad for no reason. This has been going on for a year. Denies anxiety. Has crying for no reason. Does work out and manage self care, but not helping. Does feel suicidal.  Has no plan. Does have support. Talks with parents and spouse.    The following portions of the patient's history were reviewed and updated as appropriate: allergies, current medications, past family history, past medical history, past social history, past surgical history, and problem list.  Review of Systems Pertinent items noted in HPI and remainder of comprehensive ROS otherwise negative.   Objective:    BP 99/66   Pulse 84   Wt 132 lb (59.9 kg)   BMI 23.38 kg/m  General appearance: alert, cooperative, and appears stated age Head: Normocephalic, without obvious abnormality, atraumatic Neck: no adenopathy, supple, symmetrical, trachea midline, and thyroid not enlarged, symmetric, no tenderness/mass/nodules Lungs: clear to auscultation bilaterally Breasts: normal appearance, no masses or tenderness Heart: regular rate and rhythm, S1, S2 normal, no murmur, click, rub or gallop Abdomen: soft, non-tender; bowel sounds normal; no masses,  no organomegaly Pelvic: cervix normal in appearance, external genitalia normal, no adnexal masses or tenderness, no cervical motion tenderness, uterus normal size, shape, and consistency, and vagina normal without discharge Extremities: extremities normal, atraumatic, no cyanosis or edema Pulses: 2+ and symmetric Skin: Skin color, texture, turgor normal. No rashes or lesions Lymph nodes: Cervical, supraclavicular, and axillary nodes normal. Neurologic: Grossly normal Psych: flat affect, tearful    Assessment:    Healthy female exam.      Plan:   Problem List  Items Addressed This Visit       Unprioritized   Current severe episode of major depressive disorder without psychotic features without prior episode ([place])    76226 - Contracts for safety, IBH referral for possible therapy, begin Lexapro. Usual side effect profile, onset of action, dosing, regain of energy discussed. Patient will return in 4 weeks for f/u to discuss meds, impact on mental health and dosing.      Relevant Medications   escitalopram (LEXAPRO) 10 MG tablet   Other Relevant Orders   Ambulatory referral to [place]   Other Visit Diagnoses     Screening for malignant neoplasm of cervix    -  Primary   [phone] - desires yearly pap   Relevant Orders   Cytology - PAP   Encounter for gynecological examination without abnormal finding       99396 - yearly labs, declines flu shot   Relevant Orders   CBC   TSH   Comprehensive metabolic panel   Hemoglobin A1c   VITAMIN D 25 Hydroxy (Vit-D Deficiency, Fractures)   Lipid panel   Encounter for screening mammogram for malignant neoplasm of breast       [phone] - mammogram   Relevant Orders   MM 3D SCREEN BREAST BILATERAL      Return in 4 weeks (on 10/08/2022) for a follow-up, virtual.    See After Visit Summary for Counseling Recommendations

## 2022-09-11 LAB — LIPID PANEL
Chol/HDL Ratio: 2.9 ratio (ref 0.0–4.4)
Cholesterol, Total: 163 mg/dL (ref 100–199)
HDL: 56 mg/dL (ref >39)
LDL Chol Calc (NIH): 81 mg/dL (ref 0–99)
Triglycerides: 152 mg/dL — ABNORMAL HIGH (ref 0–149)
VLDL Cholesterol Cal: 26 mg/dL (ref 5–40)

## 2022-09-11 LAB — COMPREHENSIVE METABOLIC PANEL
ALT: 28 IU/L (ref 0–32)
AST: 22 IU/L (ref 0–40)
Albumin/Globulin Ratio: 1.7 (ref 1.2–2.2)
Albumin: 4.3 g/dL (ref 3.9–4.9)
Alkaline Phosphatase: 47 IU/L (ref 44–121)
BUN/Creatinine Ratio: 11 (ref 9–23)
BUN: 9 mg/dL (ref 6–24)
Bilirubin Total: 0.5 mg/dL (ref 0.0–1.2)
CO2: 24 mmol/L (ref 20–29)
Calcium: 8.8 mg/dL (ref 8.7–10.2)
Chloride: 102 mmol/L (ref 96–106)
Creatinine, Ser: 0.83 mg/dL (ref 0.57–1.00)
Globulin, Total: 2.6 g/dL (ref 1.5–4.5)
Glucose: 73 mg/dL (ref 70–99)
Potassium: 3.6 mmol/L (ref 3.5–5.2)
Sodium: 138 mmol/L (ref 134–144)
Total Protein: 6.9 g/dL (ref 6.0–8.5)
eGFR: 91 mL/min/{1.73_m2} (ref >59)

## 2022-09-11 LAB — CBC
Hematocrit: 39.6 % (ref 34.0–46.6)
Hemoglobin: 13.1 g/dL (ref 11.1–15.9)
MCH: 28.8 pg (ref 26.6–33.0)
MCHC: 33.1 g/dL (ref 31.5–35.7)
MCV: 87 fL (ref 79–97)
Platelets: 216 10*3/uL (ref 150–450)
RBC: 4.55 x10E6/uL (ref 3.77–5.28)
RDW: 11.9 % (ref 11.7–15.4)
WBC: 4.4 10*3/uL (ref 3.4–10.8)

## 2022-09-11 LAB — HEMOGLOBIN A1C
Est. average glucose Bld gHb Est-mCnc: 97 mg/dL
Hgb A1c MFr Bld: 5 % (ref 4.8–5.6)

## 2022-09-11 LAB — VITAMIN D 25 HYDROXY (VIT D DEFICIENCY, FRACTURES): Vit D, 25-Hydroxy: 18.8 ng/mL — ABNORMAL LOW (ref 30.0–100.0)

## 2022-09-11 LAB — TSH: TSH: 1.53 u[IU]/mL (ref 0.450–4.500)

## 2022-09-11 MED ORDER — VITAMIN D (ERGOCALCIFEROL) 1.25 MG (50000 UNIT) PO CAPS: 50000.0000 [IU] | ORAL_CAPSULE | ORAL | 0 refills | Status: AC

## 2022-09-11 MED ORDER — VITAMIN D3 10 MCG (400 UNIT) PO TABS: 400.0000 [IU] | ORAL_TABLET | Freq: Every day | ORAL | 1 refills | Status: AC

## 2022-09-11 NOTE — Addendum Note (Signed)
Addended by: Donnamae Jude on: 09/11/2022 10:30 AM   Modules accepted: Orders

## 2022-09-16 LAB — CYTOLOGY - PAP
Comment: NEGATIVE
Comment: NEGATIVE
Comment: NEGATIVE
Diagnosis: NEGATIVE
HPV 16: NEGATIVE
HPV 18 / 45: NEGATIVE
High risk HPV: POSITIVE — AB

## 2022-10-08 ENCOUNTER — Encounter: Payer: Self-pay | Admitting: Family Medicine

## 2022-10-08 ENCOUNTER — Telehealth (INDEPENDENT_AMBULATORY_CARE_PROVIDER_SITE_OTHER): Payer: BC Managed Care – PPO | Admitting: Family Medicine

## 2022-10-08 DIAGNOSIS — F322 Major depressive disorder, single episode, severe without psychotic features: Secondary | ICD-10-CM | POA: Diagnosis not present

## 2022-10-08 NOTE — Assessment & Plan Note (Addendum)
Feels much improved. Does not need dose adjustment. Is coping better. Sleep and mood are improved. Will hold on this dosing for now. Declines IBH referral. Continue Lexapro.

## 2022-10-08 NOTE — Progress Notes (Signed)
    GYNECOLOGY VIRTUAL VISIT ENCOUNTER NOTE  Provider location: [facility]   Patient location: Home  I connected with [name] Kitchen on 10/08/22 at  1:10 PM EST by MyChart Video Encounter and verified that I am speaking with the correct person using two identifiers.   I discussed the limitations, risks, security and privacy concerns of performing an evaluation and management service virtually and the availability of in person appointments. I also discussed with the patient that there may be a patient responsible charge related to this service. The patient expressed understanding and agreed to proceed.   History:  Sarah Blevins is a 42 y.o. [phone] female being evaluated today for f/u depression. Started on Lexapro and is doing well. She reports coping well and does not need a medication change. Tolerating meds without issue. She denies any abnormal vaginal discharge, bleeding, pelvic pain or other concerns.       Past Medical History:  Diagnosis Date   ASCUS with positive high risk HPV on pap 03/10/2012   F/u pap    Past Surgical History:  Procedure Laterality Date   CESAREAN SECTION N/A 06/15/2014   Procedure: CESAREAN SECTION;  Surgeon: Lavonia Drafts, MD;  Location: [facility];  Service: Obstetrics;  Laterality: N/A;   LEG SURGERY     right leg blood cyst removal.   WISDOM TOOTH EXTRACTION     x2   The following portions of the patient's history were reviewed and updated as appropriate: allergies, current medications, past family history, past medical history, past social history, past surgical history and problem list.   Health Maintenance:  Normal pap and positive HRHPV on 09/10/2022.   Review of Systems:  Pertinent items noted in HPI and remainder of comprehensive ROS otherwise negative.  Physical Exam:   General:  Alert, oriented and cooperative. Patient appears to be in no acute distress.  Mental Status: Normal mood and affect.  Normal behavior. Normal judgment and thought content.   Respiratory: Normal respiratory effort, no problems with respiration noted  Rest of physical exam deferred due to type of encounter  Labs and Imaging No results found for this or any previous visit (from the past 336 hour(s)). No results found.     Assessment and Plan:     Problem List Items Addressed This Visit       Unprioritized   Current severe episode of major depressive disorder without psychotic features without prior episode ([place]) - Primary    Feels much improved. Does not need dose adjustment. Is coping better. Sleep and mood are improved. Will hold on this dosing for now. Declines IBH referral. Continue Lexapro.            I discussed the assessment and treatment plan with the patient. The patient was provided an opportunity to ask questions and all were answered. The patient agreed with the plan and demonstrated an understanding of the instructions.   The patient was advised to call back or seek an in-person evaluation/go to the ED if the symptoms worsen or if the condition fails to improve as anticipated.  I provided 12 minutes of face-to-face time during this encounter.   Donnamae Jude, MD [facility] for [employer], [place]

## 2022-11-07 ENCOUNTER — Other Ambulatory Visit: Payer: Self-pay | Admitting: Family Medicine

## 2022-11-07 DIAGNOSIS — F322 Major depressive disorder, single episode, severe without psychotic features: Secondary | ICD-10-CM

## 2022-11-14 ENCOUNTER — Other Ambulatory Visit: Payer: Self-pay | Admitting: *Deleted

## 2022-11-14 DIAGNOSIS — F322 Major depressive disorder, single episode, severe without psychotic features: Secondary | ICD-10-CM

## 2022-11-14 MED ORDER — ESCITALOPRAM OXALATE 10 MG PO TABS: 10.0000 mg | ORAL_TABLET | Freq: Every day | ORAL | 6 refills | Status: AC

## 2023-10-17 DIAGNOSIS — J069 Acute upper respiratory infection, unspecified: Secondary | ICD-10-CM | POA: Diagnosis not present

## 2023-10-17 DIAGNOSIS — R051 Acute cough: Secondary | ICD-10-CM | POA: Diagnosis not present

## 2024-01-07 DIAGNOSIS — R1031 Right lower quadrant pain: Secondary | ICD-10-CM | POA: Diagnosis not present

## 2024-01-22 ENCOUNTER — Encounter: Payer: Self-pay | Admitting: Family Medicine

## 2024-01-22 ENCOUNTER — Ambulatory Visit: Payer: Self-pay

## 2024-01-22 ENCOUNTER — Ambulatory Visit: Admitting: Family Medicine

## 2024-01-22 VITALS — BP 100/60 | HR 85 | Temp 98.3°F | Ht 63.0 in | Wt 131.0 lb

## 2024-01-22 DIAGNOSIS — R42 Dizziness and giddiness: Secondary | ICD-10-CM | POA: Diagnosis not present

## 2024-01-22 DIAGNOSIS — R194 Change in bowel habit: Secondary | ICD-10-CM | POA: Diagnosis not present

## 2024-01-22 MED ORDER — MECLIZINE HCL 25 MG PO TABS: 25.0000 mg | ORAL_TABLET | Freq: Three times a day (TID) | ORAL | 0 refills | Status: AC | PRN

## 2024-01-22 NOTE — Assessment & Plan Note (Signed)
 Positional / episodic for 3 days (episodes are brief) - some nausea during episode Reproduced with lateral gaze in office Has some nystagmus on the right   Otherwise normal exam   Encouraged good hydration  Given prescription for meclizine to use if more severe (caution of sedation)   Handout given

## 2024-01-22 NOTE — Telephone Encounter (Signed)
 1st attempt to call patient. Message left on patient's voicemail to call back for triage. Will route to callbacks.   Copied from CRM [id number]. Topic: Clinical - Red Word Triage >> Jan 22, 2024  8:51 AM Sarah Blevins wrote: Reason for CRM: Patient stated she has been dizzy, and nauseous for the past 3 days. Also stated her vision is blurry and feels like she's spinning.

## 2024-01-22 NOTE — Assessment & Plan Note (Signed)
 After eating  Not loose or abnormal  More stress lately  No abd pain or other symptoms Reassuring exam  Suspect IBS  Given handout  Encouraged to stay hydrated and try a fiber supplement daily  If no improvement follow up and consider labs and further eval

## 2024-01-22 NOTE — Progress Notes (Signed)
 Subjective:    Patient ID: Sarah Blevins, female    DOB: 09-23-1980, 43 y.o.   MRN: [id number]  HPI  Wt Readings from Last 3 Encounters:  01/22/24 131 lb (59.4 kg)  09/10/22 132 lb (59.9 kg)  01/22/21 129 lb (58.5 kg)   23.21 kg/m  Vitals:   01/22/24 1509  BP: 100/60  Pulse: 85  Temp: 98.3 F (36.8 C)  SpO2: 95%     43 yo pt of Dr Aletha Hutching presents with c/o Episode of dizziness /nausea lasting 3 minutes this am  Coming and going for 3 days  No particular trigger  Vomited once this am   Is trying to drink fluids   No headaches  No congestion  Had allergies last month  No ear pain or fullness    Frequent BMs after eating for 2 weeks  Normal /not loose  No blood No abdominal pain   Diet has not changes Eats healthy   No fever   [facility] and [facility] better  Never had a fever     Lab Results  Component Value Date   NA 138 09/10/2022   K 3.6 09/10/2022   CO2 24 09/10/2022   GLUCOSE 73 09/10/2022   BUN 9 09/10/2022   CREATININE 0.83 09/10/2022   CALCIUM 8.8 09/10/2022   EGFR 91 09/10/2022   GFRNONAA 91 11/18/2019   Lab Results  Component Value Date   ALT 28 09/10/2022   AST 22 09/10/2022   ALKPHOS 47 09/10/2022   BILITOT 0.5 09/10/2022   Lab Results  Component Value Date   WBC 4.4 09/10/2022   HGB 13.1 09/10/2022   HCT 39.6 09/10/2022   MCV 87 09/10/2022   PLT 216 09/10/2022   Lab Results  Component Value Date   TSH 1.530 09/10/2022    A lot of stress Lexapro  makes her more depressed so she stopped it     Patient Active Problem List   Diagnosis Date Noted   Dizziness 01/22/2024   Frequent bowel movements 01/22/2024   Current severe episode of major depressive disorder without psychotic features without prior episode (HCC) 09/10/2022   Cervical high risk human papillomavirus (HPV) DNA test positive 11/17/2019   Impingement syndrome of right shoulder 03/08/2019   TMJ (temporomandibular joint  disorder) 03/08/2019   Vitamin D  deficiency 07/03/2017   Past Medical History:  Diagnosis Date   ASCUS with positive high risk HPV on pap 03/10/2012   F/u pap    Past Surgical History:  Procedure Laterality Date   CESAREAN SECTION N/A 06/15/2014   Procedure: CESAREAN SECTION;  Surgeon: Lenord Radon, MD;  Location: [facility];  Service: Obstetrics;  Laterality: N/A;   LEG SURGERY     right leg blood cyst removal.   WISDOM TOOTH EXTRACTION     x2   Social History   Tobacco Use   Smoking status: Never   Smokeless tobacco: Never  Vaping Use   Vaping status: Never Used  Substance Use Topics   Alcohol use: No   Drug use: No   Family History  Problem Relation Age of Onset   Anemia Mother    Hypertension Father    No Known Allergies Current Outpatient Medications on File Prior to Visit  Medication Sig Dispense Refill   Cholecalciferol  (VITAMIN D3) 10 MCG (400 UNIT) tablet Take 1 tablet (400 Units total) by mouth daily. 90 tablet 1   Multiple Vitamin (MULTIVITAMIN) tablet Take  1 tablet by mouth daily.     naproxen sodium (ALEVE) 220 MG tablet Take 220 mg by mouth as needed.     escitalopram  (LEXAPRO ) 10 MG tablet Take 1 tablet (10 mg total) by mouth daily. Take 1/2 tab x 5-7 days, then 1 tab daily (Patient not taking: Reported on 01/22/2024) 30 tablet 6   No current facility-administered medications on file prior to visit.    Review of Systems  Constitutional:  Positive for fatigue. Negative for activity change, appetite change, fever and unexpected weight change.  HENT:  Negative for congestion, ear pain, rhinorrhea, sinus pressure and sore throat.   Eyes:  Negative for pain, redness and visual disturbance.  Respiratory:  Negative for cough, shortness of breath and wheezing.   Cardiovascular:  Negative for chest pain and palpitations.  Gastrointestinal:  Negative for abdominal pain, blood in stool, constipation and diarrhea.       Urgent bm after eating Sometimes loose   Endocrine: Negative for polydipsia and polyuria.  Genitourinary:  Negative for dysuria, frequency and urgency.  Musculoskeletal:  Negative for arthralgias, back pain and myalgias.  Skin:  Negative for pallor and rash.  Allergic/Immunologic: Negative for environmental allergies.  Neurological:  Positive for dizziness. Negative for syncope and headaches.  Hematological:  Negative for adenopathy. Does not bruise/bleed easily.  Psychiatric/Behavioral:  Negative for decreased concentration and dysphoric mood. The patient is not nervous/anxious.        Stressors / work/kids noted Chronically tired       Objective:   Physical Exam Constitutional:      General: She is not in acute distress.    Appearance: Normal appearance. She is well-developed and normal weight.     Comments: Seems fatigued  HENT:     Head: Normocephalic and atraumatic.  Eyes:     Conjunctiva/sclera: Conjunctivae normal.     Pupils: Pupils are equal, round, and reactive to light.     Comments: 2-3 beats of nystagmus to the right   Neck:     Thyroid : No thyromegaly.     Vascular: No carotid bruit or JVD.  Cardiovascular:     Rate and Rhythm: Normal rate and regular rhythm.     Heart sounds: Normal heart sounds.     No gallop.  Pulmonary:     Effort: Pulmonary effort is normal. No respiratory distress.     Breath sounds: Normal breath sounds. No wheezing or rales.  Abdominal:     General: Abdomen is flat. Bowel sounds are normal. There is no distension or abdominal bruit.     Palpations: Abdomen is soft. There is no hepatomegaly or splenomegaly.     Tenderness: There is no abdominal tenderness. There is no right CVA tenderness, left CVA tenderness, guarding or rebound. Negative signs include Murphy's sign and McBurney's sign.  Musculoskeletal:     Cervical back: Normal range of motion and neck supple.     Right lower leg: No edema.     Left lower leg: No edema.  Lymphadenopathy:     Cervical: No cervical  adenopathy.  Skin:    General: Skin is warm and dry.     Coloration: Skin is not pale.     Findings: No rash.  Neurological:     Mental Status: She is alert.     Cranial Nerves: No cranial nerve deficit.     Motor: No weakness, tremor or atrophy.     Coordination: Romberg sign negative. Coordination normal. Finger-Nose-Finger Test normal.  Gait: Gait normal.     Deep Tendon Reflexes: Reflexes are normal and symmetric. Reflexes normal.  Psychiatric:        Mood and Affect: Mood normal.           Assessment & Plan:   Problem List Items Addressed This Visit       Other   Frequent bowel movements   After eating  Not loose or abnormal  More stress lately  No abd pain or other symptoms Reassuring exam  Suspect IBS  Given handout  Encouraged to stay hydrated and try a fiber supplement daily  If no improvement follow up and consider labs and further eval       Dizziness - Primary   Positional / episodic for 3 days (episodes are brief) - some nausea during episode Reproduced with lateral gaze in office Has some nystagmus on the right   Otherwise normal exam   Encouraged good hydration  Given prescription for meclizine  to use if more severe (caution of sedation)   Handout given

## 2024-01-22 NOTE — Telephone Encounter (Signed)
 Chief Complaint: Intermittent dizziness x3 days  Symptoms: Room spinning and lasted 3 mins this morning, nausea started this morning but gone now, frequent bm after eating for two weeks Pertinent Negatives: Patient denies headache, numbness, weakness  Disposition:  [x] Appointment (In office)  Additional Notes: Pt states that every time she eats she has to have a bowel movement. Pt denies diarrhea and states it is a "normal" bowel movement. This has been going on for 2 weeks. Patient scheduled for an appointment in office today. This RN educated pt on new-worsening symptoms and when to call back/seek emergent care. Pt verbalized understanding and agrees to plan.  Reason for Disposition  [1] MODERATE dizziness (e.g., vertigo; feels very unsteady, interferes with normal activities) AND [2] has NOT been evaluated by doctor (or NP/PA) for this  Answer Assessment - Initial Assessment Questions VERTIGO: "Do you feel like either you or the room is spinning or tilting?"      Room is spinning LIGHTHEADED: "Do you feel lightheaded?" (e.g., somewhat faint, woozy, weak upon standing)     Lightheaded SEVERITY: "How bad is it?"  "Can you walk?"   - MILD: Feels slightly dizzy and unsteady, but is walking normally.   - MODERATE: Feels unsteady when walking, but not falling; interferes with normal activities (e.g., school, work).   - SEVERE: Unable to walk without falling, or requires assistance to walk without falling.     Mild, sits when dizzy ONSET:  "When did the dizziness begin?"     Three days ago AGGRAVATING FACTORS: "Does anything make it worse?" (e.g., standing, change in head position)     No CAUSE: "What do you think is causing the dizziness?"     Not sure RECURRENT SYMPTOM: "Have you had dizziness before?" If Yes, ask: "When was the last time?" "What happened that time?"     Not before OTHER SYMPTOMS: "Do you have any other symptoms?" (e.g., headache, weakness, numbness, vomiting, earache)      Denies  Protocols used: Dizziness - Vertigo-A-AH

## 2024-01-22 NOTE — Telephone Encounter (Signed)
 Will see her then Agree with [facility] /[facility] precautions

## 2024-01-22 NOTE — Patient Instructions (Addendum)
 Try not to change position quickly /especially if you have been still for a while  Keep hydrated  Look at the handout on vertigo  If it get much worse try the meclizine (I printed the prescription)  Only take it if you need it and let us  know   If you are congested-try flonase  nasal spray daily over the counter   For frequent bms -you may have some irritable bowel syndrome  (stress worsens this) Stay hydrated  Try a fiber supplement like benefiber or metamucil mixed with liquid as directed every day  This will generally help   I will give you a hand out on this also   If worse or not improving new symptoms let us  know

## 2024-01-25 NOTE — Progress Notes (Signed)
  History:  Ms. Sarah Blevins is a 43 y.o. [id number] who presents to clinic today for annual well woman visit with pap screening.  She reports she is with a new partner and would like STI screening today. She desires both blood work and vaginal swabs. She reports normal menstrual cycles.   Reports an episode of dizziness, PCP diagnosed with vertigo and she has had good resolution with Epley's maneuvers. Reports she has medication PRN, but has not needed to use this.   The following portions of the patient's history were reviewed and updated as appropriate: allergies, current medications, family history, past medical history, social history, past surgical history and problem list.  Review of Systems:  Review of Systems  All other systems reviewed and are negative.     Objective:  Physical Exam BP 112/78   Pulse 71   Ht 5\' 2"  (1.575 m)   Wt 135 lb (61.2 kg)   LMP 12/16/2023 (Exact Date)   BMI 24.69 kg/m  Physical Exam Vitals and nursing note reviewed. Exam conducted with a chaperone present.  Constitutional:      Appearance: Normal appearance. She is normal weight.  HENT:     Head: Normocephalic.  Cardiovascular:     Rate and Rhythm: Normal rate and regular rhythm.     Pulses: Normal pulses.     Heart sounds: Normal heart sounds.  Pulmonary:     Effort: Pulmonary effort is normal.     Breath sounds: Normal breath sounds.  Abdominal:     General: Abdomen is flat.     Palpations: Abdomen is soft.  Genitourinary:    General: Normal vulva.     Exam position: Lithotomy position.     Vagina: Normal.     Cervix: Normal.     Comments: Speculum used to obtain swabs for Pap screening and STI testing per patient request. Observed pink, healthy tissue, normal physiologic discharge. Patient tolerated exam well.  Musculoskeletal:        General: Normal range of motion.     Cervical back: Normal range of motion.  Skin:    General: Skin is warm and dry.     Capillary Refill: Capillary  refill takes less than 2 seconds.  Neurological:     General: No focal deficit present.     Mental Status: She is alert and oriented to person, place, and time.  Psychiatric:        Mood and Affect: Mood normal.        Behavior: Behavior normal.        Thought Content: Thought content normal.        Judgment: Judgment normal.       Labs and Imaging No results found for this or any previous visit (from the past 24 hours).  No results found.   Assessment & Plan:  1. Well woman exam (Primary) - Benign exam.  - Requests STI screening today.   2. Screening for malignant neoplasm of cervix - Has a history of Pap with high risk HPV. This is the year follow up per guidelines.   - Pap testing obtained and sample sent today.  3. Screening for malignant neoplasm of breast - Mammogram ordered   Approximately 15 minutes of face-to-face time was spent with this patient   Curlie Doughty 01/28/2024 8:37 AM

## 2024-01-28 ENCOUNTER — Other Ambulatory Visit (HOSPITAL_COMMUNITY)
Admission: RE | Admit: 2024-01-28 | Discharge: 2024-01-28 | Disposition: A | Discharge status: Home / Self Care | Source: Ambulatory Visit | Attending: Certified Nurse Midwife | Admitting: Certified Nurse Midwife

## 2024-01-28 ENCOUNTER — Ambulatory Visit (INDEPENDENT_AMBULATORY_CARE_PROVIDER_SITE_OTHER): Admitting: Certified Nurse Midwife

## 2024-01-28 ENCOUNTER — Encounter: Payer: Self-pay | Admitting: Certified Nurse Midwife

## 2024-01-28 VITALS — BP 112/78 | HR 71 | Ht 62.0 in | Wt 135.0 lb

## 2024-01-28 DIAGNOSIS — Z1231 Encounter for screening mammogram for malignant neoplasm of breast: Secondary | ICD-10-CM | POA: Diagnosis not present

## 2024-01-28 DIAGNOSIS — Z124 Encounter for screening for malignant neoplasm of cervix: Secondary | ICD-10-CM | POA: Insufficient documentation

## 2024-01-28 DIAGNOSIS — Z1159 Encounter for screening for other viral diseases: Secondary | ICD-10-CM | POA: Diagnosis not present

## 2024-01-28 DIAGNOSIS — Z113 Encounter for screening for infections with a predominantly sexual mode of transmission: Secondary | ICD-10-CM | POA: Insufficient documentation

## 2024-01-28 DIAGNOSIS — Z01419 Encounter for gynecological examination (general) (routine) without abnormal findings: Secondary | ICD-10-CM | POA: Diagnosis not present

## 2024-01-28 NOTE — Progress Notes (Signed)
 Patient presents for Annual.  LMP: 12/16/23 monthly lasting 5 days heavy first 2 days then light. Last pap: Date: 09/10/2022 +HR HPV  Contraception: None Mammogram: 2018  NO family Hx of Breast Cancer. Would like done in [place] STD Screening: Accepts /Would like Full Panel   CC: Fatigue and Dizziness pt did discuss w/ PCP.

## 2024-01-29 LAB — RPR+HBSAG+HCVAB+...
HIV Screen 4th Generation wRfx: NONREACTIVE
Hep C Virus Ab: NONREACTIVE
Hepatitis B Surface Ag: NEGATIVE
RPR Ser Ql: NONREACTIVE

## 2024-02-04 LAB — CYTOLOGY - PAP
Adequacy: ABSENT
Chlamydia: NEGATIVE
Comment: NEGATIVE
Comment: NEGATIVE
Comment: NORMAL
Diagnosis: NEGATIVE
Neisseria Gonorrhea: NEGATIVE
Trichomonas: NEGATIVE

## 2024-02-06 ENCOUNTER — Ambulatory Visit: Payer: Self-pay | Admitting: Certified Nurse Midwife

## 2024-02-06 ENCOUNTER — Other Ambulatory Visit: Payer: Self-pay | Admitting: Certified Nurse Midwife

## 2024-02-06 DIAGNOSIS — Z01419 Encounter for gynecological examination (general) (routine) without abnormal findings: Secondary | ICD-10-CM
